# Patient Record
Sex: Male | Born: 1945 | Race: White | Hispanic: No | Marital: Married | Smoking: Never smoker
Health system: Southern US, Community
[De-identification: ages and names within clinical notes are randomized; demographics above are authoritative.]

## PROBLEM LIST (undated history)

## (undated) DIAGNOSIS — E119 Type 2 diabetes mellitus without complications: Secondary | ICD-10-CM

## (undated) DIAGNOSIS — I10 Essential (primary) hypertension: Secondary | ICD-10-CM

## (undated) DIAGNOSIS — I2510 Atherosclerotic heart disease of native coronary artery without angina pectoris: Secondary | ICD-10-CM

## (undated) DIAGNOSIS — C801 Malignant (primary) neoplasm, unspecified: Secondary | ICD-10-CM

## (undated) DIAGNOSIS — I509 Heart failure, unspecified: Secondary | ICD-10-CM

## (undated) HISTORY — PX: MOUTH SURGERY: SHX715

---

## 1999-11-05 ENCOUNTER — Encounter: Payer: Self-pay | Admitting: Otolaryngology

## 1999-11-12 ENCOUNTER — Inpatient Hospital Stay (HOSPITAL_COMMUNITY): Admission: RE | Admit: 1999-11-12 | Discharge: 1999-11-19 | Payer: Self-pay | Admitting: Otolaryngology

## 1999-11-12 ENCOUNTER — Encounter: Payer: Self-pay | Admitting: Otolaryngology

## 1999-11-12 ENCOUNTER — Encounter (INDEPENDENT_AMBULATORY_CARE_PROVIDER_SITE_OTHER): Payer: Self-pay | Admitting: Specialist

## 1999-11-14 ENCOUNTER — Encounter: Payer: Self-pay | Admitting: Otolaryngology

## 2000-11-02 ENCOUNTER — Encounter: Payer: Self-pay | Admitting: Family Medicine

## 2000-11-02 ENCOUNTER — Ambulatory Visit (HOSPITAL_COMMUNITY): Admission: RE | Admit: 2000-11-02 | Discharge: 2000-11-02 | Payer: Self-pay | Admitting: Family Medicine

## 2003-06-30 HISTORY — PX: COLON SURGERY: SHX602

## 2008-11-16 ENCOUNTER — Ambulatory Visit: Payer: Self-pay | Admitting: Family Medicine

## 2008-11-16 DIAGNOSIS — E785 Hyperlipidemia, unspecified: Secondary | ICD-10-CM

## 2008-11-16 DIAGNOSIS — Z8679 Personal history of other diseases of the circulatory system: Secondary | ICD-10-CM

## 2008-11-16 DIAGNOSIS — E1149 Type 2 diabetes mellitus with other diabetic neurological complication: Secondary | ICD-10-CM

## 2008-11-16 DIAGNOSIS — G894 Chronic pain syndrome: Secondary | ICD-10-CM | POA: Insufficient documentation

## 2008-11-16 DIAGNOSIS — Z87898 Personal history of other specified conditions: Secondary | ICD-10-CM

## 2008-11-16 DIAGNOSIS — I509 Heart failure, unspecified: Secondary | ICD-10-CM | POA: Insufficient documentation

## 2008-11-16 DIAGNOSIS — I10 Essential (primary) hypertension: Secondary | ICD-10-CM | POA: Insufficient documentation

## 2008-11-16 DIAGNOSIS — C189 Malignant neoplasm of colon, unspecified: Secondary | ICD-10-CM | POA: Insufficient documentation

## 2008-11-16 DIAGNOSIS — E119 Type 2 diabetes mellitus without complications: Secondary | ICD-10-CM

## 2008-11-16 DIAGNOSIS — C029 Malignant neoplasm of tongue, unspecified: Secondary | ICD-10-CM | POA: Insufficient documentation

## 2008-11-16 LAB — CONVERTED CEMR LAB
Glucose, Bld: 181 mg/dL
INR: 1.9

## 2008-11-19 ENCOUNTER — Telehealth (INDEPENDENT_AMBULATORY_CARE_PROVIDER_SITE_OTHER): Payer: Self-pay | Admitting: *Deleted

## 2010-07-29 ENCOUNTER — Inpatient Hospital Stay (HOSPITAL_COMMUNITY)
Admission: EM | Admit: 2010-07-29 | Discharge: 2010-08-01 | DRG: 603 | Disposition: A | Discharge status: Home Health | Payer: Medicare Other | Attending: Internal Medicine | Admitting: Internal Medicine

## 2010-07-29 DIAGNOSIS — I4891 Unspecified atrial fibrillation: Secondary | ICD-10-CM | POA: Diagnosis present

## 2010-07-29 DIAGNOSIS — L0231 Cutaneous abscess of buttock: Principal | ICD-10-CM | POA: Diagnosis present

## 2010-07-29 DIAGNOSIS — E871 Hypo-osmolality and hyponatremia: Secondary | ICD-10-CM | POA: Diagnosis present

## 2010-07-29 DIAGNOSIS — E785 Hyperlipidemia, unspecified: Secondary | ICD-10-CM | POA: Diagnosis present

## 2010-07-29 DIAGNOSIS — Z85038 Personal history of other malignant neoplasm of large intestine: Secondary | ICD-10-CM

## 2010-07-29 DIAGNOSIS — L89309 Pressure ulcer of unspecified buttock, unspecified stage: Secondary | ICD-10-CM | POA: Diagnosis present

## 2010-07-29 DIAGNOSIS — E1149 Type 2 diabetes mellitus with other diabetic neurological complication: Secondary | ICD-10-CM | POA: Diagnosis present

## 2010-07-29 DIAGNOSIS — E1142 Type 2 diabetes mellitus with diabetic polyneuropathy: Secondary | ICD-10-CM | POA: Diagnosis present

## 2010-07-29 DIAGNOSIS — I10 Essential (primary) hypertension: Secondary | ICD-10-CM | POA: Diagnosis present

## 2010-07-29 DIAGNOSIS — I5022 Chronic systolic (congestive) heart failure: Secondary | ICD-10-CM | POA: Diagnosis present

## 2010-07-29 DIAGNOSIS — I509 Heart failure, unspecified: Secondary | ICD-10-CM | POA: Diagnosis present

## 2010-07-29 DIAGNOSIS — L8990 Pressure ulcer of unspecified site, unspecified stage: Secondary | ICD-10-CM | POA: Diagnosis present

## 2010-07-29 DIAGNOSIS — Z7901 Long term (current) use of anticoagulants: Secondary | ICD-10-CM

## 2010-07-29 LAB — DIFFERENTIAL
Basophils Absolute: 0 10*3/uL (ref 0.0–0.1)
Basophils Relative: 0 % (ref 0–1)
Eosinophils Absolute: 0.2 10*3/uL (ref 0.0–0.7)
Eosinophils Relative: 3 % (ref 0–5)
Lymphocytes Relative: 12 % (ref 12–46)
Lymphs Abs: 1 10*3/uL (ref 0.7–4.0)
Monocytes Absolute: 0.9 10*3/uL (ref 0.1–1.0)
Monocytes Relative: 12 % (ref 3–12)
Neutro Abs: 5.7 10*3/uL (ref 1.7–7.7)
Neutrophils Relative %: 73 % (ref 43–77)

## 2010-07-29 LAB — BASIC METABOLIC PANEL
BUN: 5 mg/dL — ABNORMAL LOW (ref 6–23)
CO2: 28 mEq/L (ref 19–32)
Calcium: 8.9 mg/dL (ref 8.4–10.5)
Chloride: 85 mEq/L — ABNORMAL LOW (ref 96–112)
Creatinine, Ser: 0.8 mg/dL (ref 0.4–1.5)
GFR calc Af Amer: >60 mL/min (ref >60)
GFR calc non Af Amer: >60 mL/min (ref >60)
Glucose, Bld: 265 mg/dL — ABNORMAL HIGH (ref 70–99)
Potassium: 3.7 mEq/L (ref 3.5–5.1)
Sodium: 124 mEq/L — ABNORMAL LOW (ref 135–145)

## 2010-07-29 LAB — CBC
HCT: 37.6 % — ABNORMAL LOW (ref 39.0–52.0)
Hemoglobin: 13.6 g/dL (ref 13.0–17.0)
MCH: 33.4 pg (ref 26.0–34.0)
MCHC: 36.2 g/dL — ABNORMAL HIGH (ref 30.0–36.0)
MCV: 92.4 fL (ref 78.0–100.0)
Platelets: 217 10*3/uL (ref 150–400)
RBC: 4.07 MIL/uL — ABNORMAL LOW (ref 4.22–5.81)
RDW: 12.2 % (ref 11.5–15.5)
WBC: 7.8 10*3/uL (ref 4.0–10.5)

## 2010-07-29 LAB — PROTIME-INR: Prothrombin Time: 26.4 seconds — ABNORMAL HIGH (ref 11.6–15.2)

## 2010-07-29 NOTE — Letter (Signed)
Summary: New patient H and P  New patient H and P   Imported By: Donneta Romberg 11/19/2008 11:12:48  _____________________________________________________________________  External Attachment:    Type:   Image     Comment:   External Document

## 2010-07-30 LAB — CBC
MCH: 33.5 pg (ref 26.0–34.0)
MCV: 92.7 fL (ref 78.0–100.0)
Platelets: 204 10*3/uL (ref 150–400)
RDW: 12.3 % (ref 11.5–15.5)
WBC: 6.6 10*3/uL (ref 4.0–10.5)

## 2010-07-30 LAB — COMPREHENSIVE METABOLIC PANEL
ALT: 18 U/L (ref 0–53)
AST: 21 U/L (ref 0–37)
Alkaline Phosphatase: 45 U/L (ref 39–117)
CO2: 31 mEq/L (ref 19–32)
Calcium: 9 mg/dL (ref 8.4–10.5)
Chloride: 91 mEq/L — ABNORMAL LOW (ref 96–112)
GFR calc Af Amer: >60 mL/min (ref >60)
GFR calc non Af Amer: >60 mL/min (ref >60)
Potassium: 3.9 mEq/L (ref 3.5–5.1)
Sodium: 129 mEq/L — ABNORMAL LOW (ref 135–145)

## 2010-07-30 LAB — DIFFERENTIAL
Basophils Relative: 0 % (ref 0–1)
Eosinophils Absolute: 0.3 10*3/uL (ref 0.0–0.7)
Eosinophils Relative: 4 % (ref 0–5)
Lymphs Abs: 0.8 10*3/uL (ref 0.7–4.0)

## 2010-07-30 LAB — PROTIME-INR: INR: 2.89 — ABNORMAL HIGH (ref 0.00–1.49)

## 2010-07-30 LAB — GLUCOSE, CAPILLARY
Glucose-Capillary: 232 mg/dL — ABNORMAL HIGH (ref 70–99)
Glucose-Capillary: 285 mg/dL — ABNORMAL HIGH (ref 70–99)

## 2010-07-31 LAB — PROTIME-INR
INR: 3.13 — ABNORMAL HIGH (ref 0.00–1.49)
Prothrombin Time: 32.2 seconds — ABNORMAL HIGH (ref 11.6–15.2)

## 2010-07-31 LAB — BASIC METABOLIC PANEL
GFR calc non Af Amer: >60 mL/min (ref >60)
Potassium: 3.9 mEq/L (ref 3.5–5.1)
Sodium: 130 mEq/L — ABNORMAL LOW (ref 135–145)

## 2010-07-31 LAB — GLUCOSE, CAPILLARY
Glucose-Capillary: 274 mg/dL — ABNORMAL HIGH (ref 70–99)
Glucose-Capillary: 268 mg/dL — ABNORMAL HIGH (ref 70–99)
Glucose-Capillary: 274 mg/dL — ABNORMAL HIGH (ref 70–99)

## 2010-08-01 LAB — WOUND CULTURE

## 2010-08-01 LAB — PROTIME-INR: INR: 2.72 — ABNORMAL HIGH (ref 0.00–1.49)

## 2010-08-03 NOTE — Discharge Summary (Signed)
Lawrence Mcknight, Lawrence Mcknight                 ACCOUNT NO.:  [id number]  MEDICAL RECORD NO.:  [id number]           PATIENT TYPE:  I  LOCATION:  A318                          FACILITY:  [facility]  PHYSICIAN:  Marinda Elk, M.D.DATE OF BIRTH:  1946/03/18  DATE OF ADMISSION:  07/29/2010 DATE OF DISCHARGE:  02/03/2012LH                              DISCHARGE SUMMARY   PRIMARY CARE DOCTOR:  Dr. Augustina Mood Ward at [place], [place].  DISCHARGE DIAGNOSES: 1. Bed sore ulcer with cellulitis. 2. Diabetes type 2. 3. Chronic systolic heart failure. 4. Atrial fibrillation, currently on Coumadin. 5. Hyponatremia with an admission sodium of 124, on the day of     discharge 130.  DISCHARGE MEDICATIONS: 1. Doxycycline 100 mg p.o. b.i.d. for 14 days. 2. __________  mg daily. 3. Vitamin B12 three times daily. 4. Digoxin 0.125 mg 3 times daily. 5. Fenofibrate 145 mg one tablet daily. 6. Gabapentin 100 mg b.i.d. 7. Glipizide 10 mg daily. 8. Vicodin 7.5 mg/500 mg one tablet b.i.d. 9. Hydroxyzine 25 mg 2 tablets q.4 h. 10.Hydroxyzine pamoate 25 mg daily at bedtime. 11.Lasix 40 mg daily. 12.Metformin 1000 mg b.i.d. 13.Metoprolol succinate 50 mg daily. 14.NPH 20 units in the morning, 10 units in the evening subcutaneous     daily. 15.Ranitidine 150 mg 4 times a day. 16.Simvastatin 40 mg at bedtime. 17.Warfarin 2.5 mg daily.  IMAGING:  CT angiogram of the abdomen and pelvis showed a small supraumbilical hernia containing fat, right inguinal hernia containing fat, and anastomosis of the rectosigmoid compatible with prior tumor resection, minimal rectal wall thickening, maybe related to prior surgery, no small bowel obstruction.  There is skin thickening and subcutaneous infiltration at medial aspect of superior left buttock adjacent to crease, question cellulitis, full growing inflammatory process without underlying abscess collection.  We will follow up CT recommended in 4 months.  BRIEF ADMITTING HISTORY AND  PHYSICAL:  Please relate to H and P from July 29, 2010, for further details by Dr. Pleas Koch.  BRIEF HOSPITAL COURSE: 1. Cellulitis with pressure ulcer.  He was started on vancomycin.  He     did not spike any fever during his hospital stay.  His white count     remains stable, but the redness continued to decrease with     vancomycin.  He was changed to doxy IV, which he tolerated well.     He developed no adverse effect and his redness continued to     decrease.  We consulted Wound Care, they have recommended he also     follow up with his PCP in 2 weeks. 2. Diabetes type 2, no changes were made. 3. Chronic diastolic heart failure.  He is on dig where he says his     ACE was increased, and he is continued on beta-blockers, no further     changes were made. 4. Atrial fibrillation, currently rate controlled and INR therapeutic. 5. Hyponatremia, this improved with IV fluids, it is probably     secondary to dehydration.  Vitals on the day of discharge, temperature 93, pulse 73, respirations 19, and blood pressure 133/80.  He  was sattting 95% on room air.  Labs on the day of discharge showed his INR 2.7.  Wound cultures grew moderate staph, sensitive to the oxy.  DISPOSITION:  The patient will follow up with his primary care doctor in 1-2 weeks.  He will see where his blood pressure is doing.  Home health will also follow up with him.  They have explained and I have explained to him that he needs to be moving around and it is a probably a pressure ulcer that started all this.  Even since he has developed his peripheral neuropathy from his diabetes which he is on gabapentin for, he said it bothers him somewhat, so he will follow up with his primary care doctor.     Marinda Elk, M.D.     AF/MEDQ  D:  08/01/2010  T:  08/02/2010  Job:  [id number]  cc:   Augustina Mood Ward, Dr.  Electronically Signed by Lambert Keto M.D. on 08/03/2010 12:05:25 PM

## 2010-08-04 NOTE — H&P (Signed)
NAMEHODGE, STACHNIK NO.:  [id number]  MEDICAL RECORD NO.:  [id number]          PATIENT TYPE:  EMS  LOCATION:  ED                            FACILITY:  [facility]  PHYSICIAN:  Pleas Koch, MD        DATE OF BIRTH:  [birth year]  DATE OF ADMISSION:  07/29/2010 DATE OF DISCHARGE:  LH                             HISTORY & PHYSICAL   CURRENT FACILITY:  [facility].  PRIMARY CARE PHYSICIAN:  Ronn Melena, MD at [place], [place].  ONCOLOGIST:  Tyrone Apple. Lattie Corns, MD  CHIEF COMPLAINT: 1. Bedsore with cellulitic changes in a diabetic. 2. CHF, well compensated on Coumadin. 3. Hyponatremia, which is chronic.  HISTORY OF PRESENT ILLNESS:  The patient is a 65 year old male with known CHF; history of colon cancer resected in 2005; cancer of the tongue, status post treatment with Dr. Pollyann Kennedy, ENT.  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  The patient went to [facility] and it was noted and was seen by Dr. Wende Crease and was referred over for open ulcers with surrounding erythema tenderness in duration.  The patient states that he has had no fever, has no vomiting, thing has no nausea, he has never had an ulcer before.  The patient is nonmotile at baseline because of severe neuropathy and gets around in wheelchair.  The patient has been this way for the past 2-3 years.  The patient does state that when he lays too flat, he does get a little bit short of breath and this makes him uncomfortable.  He does have swelling of his lower extremities at baseline.  PAST MEDICAL HISTORY:  Significant for having colon cancer as dictated above and having chemotherapy subsequent to that.  He also has CHF, atrial fibrillation, hypertension, hyperlipidemia.  PAST  SURGICAL HISTORY:  As noted above.  The patient is not allergic to anything.  SOCIAL HISTORY:  The patient grew tobacco as a younger man and was in the [employer] in [place].  He used to smoke two packs per day for long time and then quit in 2001 subsequent to his diagnosis of cancer.  The patient also used to drink pretty heavily.  CURRENT MEDICATIONS:  Have been reconciled as; 1. Cyanocobalamin 1000 mg one tablet daily. 2. Digoxin 0.125 mg three tablets a day and this will be verified with     pharmacy. 3. Lasix 40 mg once a day. 4. Gabapentin 100 mg one to three times a day. 5. Gemfibrozil 600 one to two times a day. 6. Glipizide 10 mg one to two times a day. 7. Hydrocodone 7.5 as needed. 8. Insulin NPH 20/10 nightly. 9. Lisinopril mg once a day. 10.Metformin one to two times a day. 11.Metoprolol 100 mg half tablet per day, I think this is the XL     formation. 12.Simvastatin 40 mg at bedtime. 13.Warfarin specialized dosing.  REVIEW OF SYSTEMS:  As  above.  PHYSICAL EXAMINATION:  GENERAL:  The patient is an alert Caucasian male. No significant distress.  The patient has what appeared to be cataractous changes.  He also has swellings on the outer canthus of the eye. NECK:  Supple and soft.  I do not appreciate any JVD.  He is edentulous. He has bolus nose, which looks like rhinophyma.  I do not appreciate any bruits in the neck. CARDIOVASCULAR:  He has a soft murmur in the left and right upper sternal border.  No radiation.  I do not appreciate any rubs or gallops. ABDOMEN:  Soft, nontender with postsurgical changes. SKIN:  The patient has a large confluent area on the left buttocks, which has significant erythema and also has some element of a white denudation on the top of that.  Area is about 7-10 cm in size and it is irregular and has significant surrounding erythema. CENTRAL NERVOUS SYSTEM:  Cranial nerve II through XII grossly intact. He has very poor sensation and  has sensation only the L1-2.  Anteriorly, he has no sensation below knee.  The patient wears gloves at baseline because he has significant skin sensitivity as well on his hands.  Workup done in ED is as follows:  The patient had a BMET showing sodium of 124, potassium 3.7, chloride of 85, CO2 28, glucose 265, BUN of 5, creatinine 0.8, calcium 8.9.  Hemoglobin 13.6, hematocrit 37.6, WBCs 7.8, platelet count 217.  INR of 2.41.  He had a CT of the abdomen and pelvis which showed skin thickening of subcutaneous infiltration of medial aspect of superior left buttock adjacent to question cellulitis over the major process without underlying abscess collection small, supraumbilical right inguinal hernia containing with bladder wall thickening, nonspecific minimal rectal wall thickening question related to prior radiation, single borderline left internal inguinal lymph nodes.  In light of history of colon cancer, they recommend followup CT in 4 months to recess borderline large left internal iliac node to exclude metastatic disease, disease recurrence.  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 1. Cellulitis.  We will continue treatment as per ED physician with     clindamycin 600 mg q.6 hourly for 2 days and likely we will     transition to oral antibiotics.  We will get PT/OT to assess and     get a doughnut shape for the patient to sit on.  When seated, we     will get him out of bed with chair.  He has had a wound culture     done and we will follow this up.  I would hold on blood cultures  at     present. 2. Diabetes mellitus.  We will get an A1c to determine what is blood     control is as his glucose in the ED was 265.  He will continue on     his Lantus and we will give him sensitive coverage insulin for now.  We will hold his metformin and his glipizide at the present time. 1. For his anemia, we will continue cyanocobalamin. 2. For his heart failure, we will continue digoxin, but we will need     to reconcile the medications.  I will get digoxin level.  He will     continue his Lasix for now and his lisinopril and metoprolol at     above doses. 3. For his hyperlipidemia, we will continue Zocor. 4. History of colon cancer.  The patient may need further imaging and     this can be done as an outpatient at the [facility] when he follows     up with them.  Being on Coumadin, this may likely be because he has     an ejection fraction below 30%, or this may be because of his     atrial fibrillation.  We will get pharmacy to dose this.  The patient is a full code and will be admitted to [facility].                                           ______________________________ Pleas Koch, MD     JS/MEDQ  D:  07/29/2010  T:  07/29/2010  Job:  [id number]  cc:   Jeanice Lim, [place]  Electronically Signed by Pleas Koch MD on 08/04/2010 11:42:41 AM

## 2010-09-12 ENCOUNTER — Emergency Department (HOSPITAL_COMMUNITY): Payer: Medicare Other

## 2010-09-12 ENCOUNTER — Emergency Department (HOSPITAL_COMMUNITY)
Admission: EM | Admit: 2010-09-12 | Discharge: 2010-09-13 | Disposition: A | Discharge status: Home / Self Care | Payer: Medicare Other | Attending: Emergency Medicine | Admitting: Emergency Medicine

## 2010-09-12 DIAGNOSIS — Z79899 Other long term (current) drug therapy: Secondary | ICD-10-CM | POA: Insufficient documentation

## 2010-09-12 DIAGNOSIS — E119 Type 2 diabetes mellitus without complications: Secondary | ICD-10-CM | POA: Insufficient documentation

## 2010-09-12 DIAGNOSIS — I4891 Unspecified atrial fibrillation: Secondary | ICD-10-CM | POA: Insufficient documentation

## 2010-09-12 DIAGNOSIS — I509 Heart failure, unspecified: Secondary | ICD-10-CM | POA: Insufficient documentation

## 2010-09-12 DIAGNOSIS — R55 Syncope and collapse: Secondary | ICD-10-CM | POA: Insufficient documentation

## 2010-09-12 LAB — COMPREHENSIVE METABOLIC PANEL
ALT: 16 U/L (ref 0–53)
AST: 19 U/L (ref 0–37)
Albumin: 3.6 g/dL (ref 3.5–5.2)
Alkaline Phosphatase: 37 U/L — ABNORMAL LOW (ref 39–117)
Calcium: 9.2 mg/dL (ref 8.4–10.5)
GFR calc Af Amer: >60 mL/min (ref >60)
Potassium: 4.3 mEq/L (ref 3.5–5.1)
Sodium: 124 mEq/L — ABNORMAL LOW (ref 135–145)
Total Protein: 6.6 g/dL (ref 6.0–8.3)

## 2010-09-12 LAB — DIFFERENTIAL
Basophils Absolute: 0.1 10*3/uL (ref 0.0–0.1)
Basophils Relative: 1 % (ref 0–1)
Eosinophils Relative: 18 % — ABNORMAL HIGH (ref 0–5)
Lymphocytes Relative: 10 % — ABNORMAL LOW (ref 12–46)
Monocytes Absolute: 1.1 10*3/uL — ABNORMAL HIGH (ref 0.1–1.0)
Neutro Abs: 6.4 10*3/uL (ref 1.7–7.7)

## 2010-09-12 LAB — URINALYSIS, ROUTINE W REFLEX MICROSCOPIC
Bilirubin Urine: NEGATIVE
Glucose, UA: 100 mg/dL — AB
Hgb urine dipstick: NEGATIVE
Ketones, ur: NEGATIVE mg/dL
pH: 5.5 (ref 5.0–8.0)

## 2010-09-12 LAB — CBC
Hemoglobin: 14.4 g/dL (ref 13.0–17.0)
MCHC: 35 g/dL (ref 30.0–36.0)
RDW: 13 % (ref 11.5–15.5)
WBC: 10.5 10*3/uL (ref 4.0–10.5)

## 2010-11-14 NOTE — Consult Note (Signed)
[facility]  Patient:    [name], Lawrence Mcknight                          MRN: [id number] Proc. Date: 11/15/99 Adm. Date:  [id number] Attending:  Serena Colonel H CC:         Miguel Aschoff, M.D.                          Consultation Report  INDICATIONS:  Atrial flutter with rapid ventricular response.  CONCLUSIONS: 1. Atrial flutter with 2:1 AV conduction and ventricular response of 155 beats    per minute.    a. Resistant to IV Cardizem.    b. Hemodynamically stable with systolic pressure greater than or equal to       120. 2. Squamous cell carcinoma of the tongue.    a. Status post hemiglossectomy and neck dissection on Nov 12, 1999. 3. History of tobacco abuse. 4. No history of heart disease or lung disease.  RECOMMENDATIONS: 1. IV Cardizem and bolus IV beta blocker therapy for rate control. 2. IV Corvert after ventricular rate is controlled. 3. If IV Corvert consider IV amiodarone.  BRIEF HISTORY:  The patient is 65 years of age.  He is recently status post hemiglossectomy for tongue cancer.  This afternoon in MICU he developed atrial flutter with rapid ventricular response but hemodynamic stability.  A total of 30 mg of IV Cardizem was given and an IV infusion of Cardizem at 12 mg per hour was started.  This did not slow the ventricular response.  IV Adenocard 6 mg gave transient high grade AV block revealing atrial flutter waves.  He is hemodynamically stable and having no cardiopulmonary symptoms.  He has no history of supraventricular tachycardia.  PHYSICAL EXAMINATION:  VITAL SIGNS:  The patients blood pressure is 120/80, heart rate is 150, monitor reveals atrial flutter with 2:1 conduction and a ventricular rate of 155.  CHEST:  Chest is clear.  CARDIAC:  Reveals no murmurs, rubs, or clicks.  EXTREMITIES:  Reveal no edema.  EKG on Nov 05, 1999, reveals incomplete right bundle branch block with QT of 404 milliseconds. DD:   11/15/99 TD:  11/15/99 Job: [id number]

## 2010-11-14 NOTE — Discharge Summary (Signed)
[place]  Patient:    Lawrence Mcknight, Lawrence Mcknight                          MRN: [id number] Adm. Date:  [id number] Disc. Date: [id number] Attending:  Serena Colonel H CC:         Dr. Sherrin Daisy, [place]                           Discharge Summary  ADMISSION DIAGNOSIS:  Squamous cell carcinoma floor of mouth.  DISCHARGE DIAGNOSES: 1. Squamous cell carcinoma floor of mouth. 2. Status post atrial flutter and possible hypertension.  PROCEDURES:  Bilateral neck dissection, floor of mouth resection, laryngoscopy, and esophagoscopy with biopsy.  CONSULTATIONS: 1. Dr. Anastasio Auerbach from hospitalist service. 2. Cardiology.  COMPLICATIONS:  Postoperative development of atrial flutter and hypertension.  HISTORY OF PRESENT ILLNESS:  [age]-year-old gentleman admitted for surgical treatment of a floor of mouth carcinoma.  HOSPITAL COURSE:  He tolerated the above-mentioned operative procedures very well and was transferred to the intensive care unit postoperatively.  He spent about three or four days in the intensive care unit, mainly because of the cardiac complications.  He was followed by the hospitalist service and the cardiology service, and medically treated for the atrial flutter.  At the time of discharge, he was in normal sinus rhythm for about three days, with stable vital signs.  He was having some hypertension that was treated with Cardizem, but his blood pressure had normalized on the day of discharge, and plan was made for him to follow up with his primary care physician.  As far as the surgery was concerned, he tolerated that very well and had his drains removed on postoperative day #4, had his feeding tube removed on postoperative day #6, and was on a soft diet at the time of discharge.  The pathologic findings revealed clear margins and no pathologic lymph nodes in the neck dissection specimens.  He was discharged to home in satisfactory condition.   Instructed to stay on a soft diet.  Use Lortab elixir 3 or 4 teaspoons every four to six hours for pain, and to follow up with his primary care physician about his cardiac problems and his hypertension.  He is to follow up with me in two to three weeks.  His primary care physician is Dr. Regino Schultze in [place]. DD:  11/19/99 TD:  11/22/99 Job: [id number]

## 2010-11-14 NOTE — Op Note (Signed)
[place]  Patient:    Lawrence Mcknight, Lawrence Mcknight                          MRN: [id number] Proc. Date: 11/12/99 Adm. Date:  [id number] Attending:  Susy Frizzle CC:         Kirk Ruths, M.D., [place], [place]                           Operative Report  PREOPERATIVE DIAGNOSIS:  T2, N0 squamous cell carcinoma, left floor of mouth.  POSTOPERATIVE DIAGNOSIS:  T2, N0 squamous cell carcinoma, left floor of mouth.  PROCEDURE: 1. Direct laryngoscopy. 2. Esophagoscopy with biopsy. 3. Floor of mouth/tongue resection with primary closure. 4. Bilateral modified neck dissection.  SURGEON:  Jefry H. Pollyann Kennedy, M.D.  ASSISTANT:  Margit Banda. Jearld Fenton, M.D.  ANESTHESIA:  General endotracheal anesthesia was used.  COMPLICATIONS:  None.  FINDINGS: 1. Irregular nodular-type mass involving the posterior wall of the esophagus,    approximately 5 cm below the cricopharyngeus. 2. A 4.0 x 2.5-cm ulcerative mass involving the left floor of mouth,    approximately 1 cm off the midline anteriorly. 3. Bilateral multiple firm lymph nodes from 1 to 2 cm in diameter.  ESTIMATED BLOOD LOSS:  100 cc.  SPECIMENS TO PATHOLOGY: 1. Esophageal biopsy. 2. Left lingual nerve posterior margin resection; frozen section negative for    tumor. 3. Floor of mouth/tongue resection. 4. Deep margin floor of mouth; frozen section negative for tumor. 5. Bilateral modified neck dissection.  REFERRING PHYSICIAN:  Kirk Ruths, M.D.  HISTORY:  This is a gentleman with a recent history of soreness under the tongue on the left side with biopsy-proven squamous cell carcinoma.  Risks, benefits, alternatives and complications of the procedure were explained to the patient and his wife, who seemed to understand and agreed to surgery.  DESCRIPTION OF PROCEDURE:  Patient was taken to the operating room and placed on the operating table in a supine position.  Following the induction of general  endotracheal anesthesia via the nasotracheal route, the table was turned 90 degrees and the patient was positioned for upper endoscopy.  #1 - Direct laryngoscopy:  An anterior commissure scope was used to view the laryngeal structures including the hypopharynx and the larynx.  There were no abnormalities identified.  Careful evaluation was accomplished including the piriform sinuses, the interarytenoid area, the post-glottic area as well as the vallecula.  #2 - Esophagoscopy:  A rigid esophagoscope was entered into the oral cavity, passed through the cricopharyngeus and passed as far down as the scope would reached.  It was then gently brought back, inspecting all four quadrants of the esophageal wall.  The above-mentioned finding was noted in the posterior wall and biopsy was taken of this area.  This was sent for pathologic evaluation.  The scope was then removed.  The table was turned to its normal position and the patient was prepped and draped in a standard fashion.  #3 - Floor of mouth/tongue resection:  A towel clip was used to retract the tip of the tongue outward and towards the right side and bilateral cheek retractors were used with visualization.  The ulcerative lesion was identified and was excised using electrocautery, initially to mark the mucosa, providing 1- to 2-cm grossly clear margins in all directions.  Careful dissection through the musculature layers of the tongue was accomplished.  The lingual  artery was identified and ligated with silk suture.  The lingual nerve was identified and was followed back to just behind the angle of the mandible and biopsy was taken.  The lesion was then removed in its entirety, making sure to keep a healthy cuff of tissue surrounding all dimensions.  An extra-deep margin was taken for frozen section and was clear.  The resection specimen was labeled appropriately and sent for pathologic evaluation.  #4 - Bilateral modified neck  dissection:  An apron incision was outlined from mastoid tip to mastoid tip, crossing over the midportion of the thyroid cartilage, and was incised with electrocautery.  Dissection continued through subcutaneous tissue and platysma and subplatysmal flaps were developed superiorly to the entire mandible and inferiorly to the clavicles.  Bilateral modified neck dissections were accomplished, encompassing levels through 1 through 3 on both sides.  The following structures were identified and preserved bilaterally:  The marginal mandibular branch of the facial nerve, the mylohyoid muscle, the anterior and posterior bellies of the digastric muscles, the hypoglossal nerves, the common carotid, external and internal carotid arteries, the internal jugular vein, the vagus nerve, the cutaneous branches of the cervical plexus, the spinal accessory nerve.  The limits of dissection were as follows:  Superiorly and posteriorly, the fascia overlying the splenius capitis and levator scapular muscles; posteriorly, the cutaneous branches of the cervical plexus; inferiorly the crossing point between the omohyoid muscle and the sternocleidomastoid muscle; anteriorly, the strap muscles; and superiorly, the mandible.  The facial vessels were ligated.  The fibrofatty tissue between the anterior bellies of the digastric muscle was carefully cleaned out as well.  During the lower dissection in level 3 on the left side, a very large-caliber lymphatic duct was identified and was clamped with a right-angle clamp.  There were multiple nodes seen throughout all levels on both sides.  The specimen was labeled appropriately and sent for pathologic evaluation.  The wound was irrigated with saline and with electrocautery and silk ties were used as needed for hemostasis.  Two 10-French round drains were left, one on each side of the neck in the wound, and exited through separate incisions, secured in place with silk suture.   The wound was closed in layers using 3-0 chromic on the platysmal layer and staples on the skin.  Patient was then awakened, extubated and transferred to  recovery in stable condition with a nasogastric feeding tube in the right side and a nasal trumpet on the left side. DD:  11/12/99 TD:  11/17/99 Job: [id number]

## 2010-11-14 NOTE — H&P (Signed)
[place]  Patient:    Lawrence Mcknight, Lawrence Mcknight                          MRN: [id number] Adm. Date:  11/12/99 Attending:  Jeannett Senior. Pollyann Kennedy, M.D. CC:         Dr. Sherrin Daisy, [place]                         History and Physical  ADMISSION DIAGNOSIS:  Floor of mouth carcinoma.  HISTORY:  This 65 year old with a 3-4 week history of a painful sore under his tongue on the left side with pain radiating towards the left ear.  It is worse when he swallows.  He denies any numbness or tingling of the mouth, lips, or tongue.  He underwent a biopsy of this lesion and it revealed a squamous cell carcinoma.  PAST MEDICAL HISTORY:  Negative.  MEDICATIONS:  None.  SOCIAL HISTORY:  He quit drinking 8 years ago.  Currently smokes one pack per day and drinks a large amount of coffee, approximately two pots each day.  REVIEW OF SYSTEMS:  Otherwise negative.  Negative for weight loss or any loss of appetite.  PHYSICAL EXAMINATION:  GENERAL:  Healthy-appearing gentleman in no distress.  HEENT:  There is no palpable adenopathy.  Oral cavity and pharynx:  There is a 3.5 x 1.5 cm ulceration involving the left lateral floor of mouth up into the anterior floor of mouth and undersurface of the tongue.  It is off of the alveolar ridge.  He is completely edentulous.  CHEST:  Clear to auscultation.  HEART:  Regular rate and rhythm.  ABDOMEN:  Soft.  EXTREMITIES:  Unremarkable.  IMPRESSION AND PLAN:  T2, N0, squamous cell carcinoma left floor of mouth and tongue.  RECOMMENDATIONS:  Recommend admission to the hospital at which time he will undergo resection of floor of mouth and partial glossectomy, bilateral modified neck dissection for staging purposes, laryngoscopy and esophagoscopy to search for a second primary, and possible tracheostomy. DD:  11/11/99 TD:  11/11/99 Job: [id number]

## 2012-04-12 IMAGING — CT CT PELVIS W/ CM
2 of 4 series · 15 of 46 positions shown, 17 images · IV contrast (omnipaque)
Comparison: None

CLINICAL DATA: Draining wound at but not, erythema and induration,
past history of carcinoma of the colon with surgery, chemotherapy
and radiation therapy, hypertension, diabetes

CT PELVIS WITH CONTRAST
TECHNIQUE: Multidetector CT imaging of the pelvis was performed
using the standard protocol following the bolus administration of
intravenous contrast.  Sagittal and coronal MPR images
reconstructed from axial data set.
Contrast:  100 ml Omnipaque 300 IV; No oral contrast administered.

[Series 4: pel_with 3.0 spo · coronal · 0.60mm/px · 3 of 105 slices shown]
[im 35/105  soft-tissue]
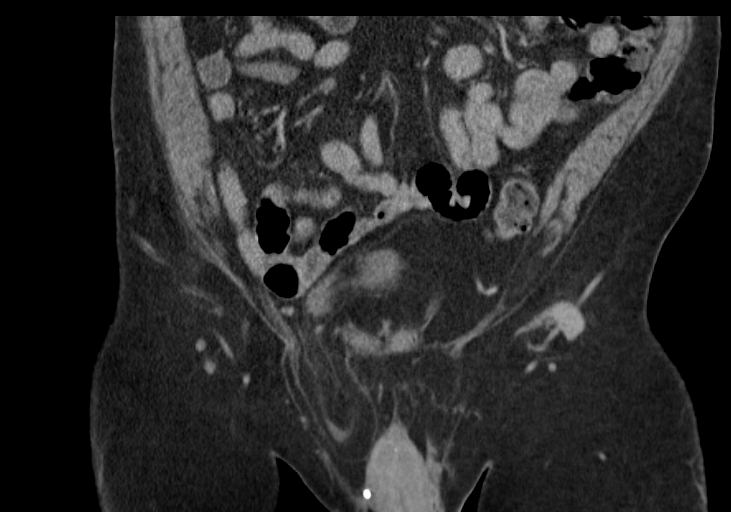
[im 47/105  soft-tissue]
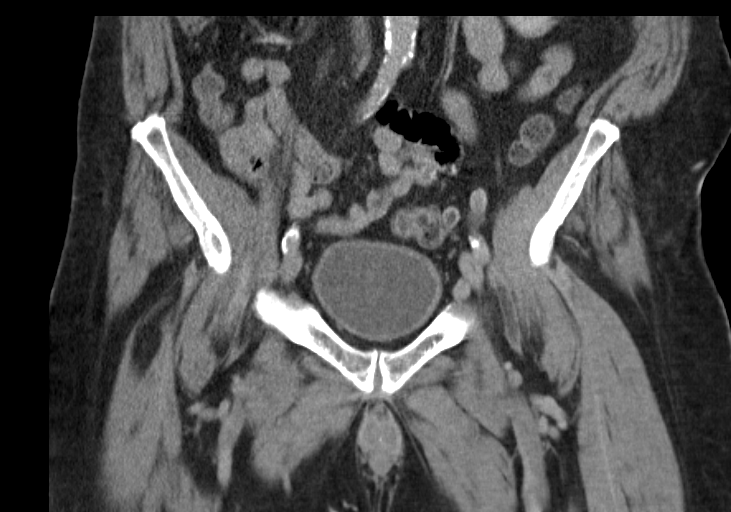
[im 58/105  soft-tissue]
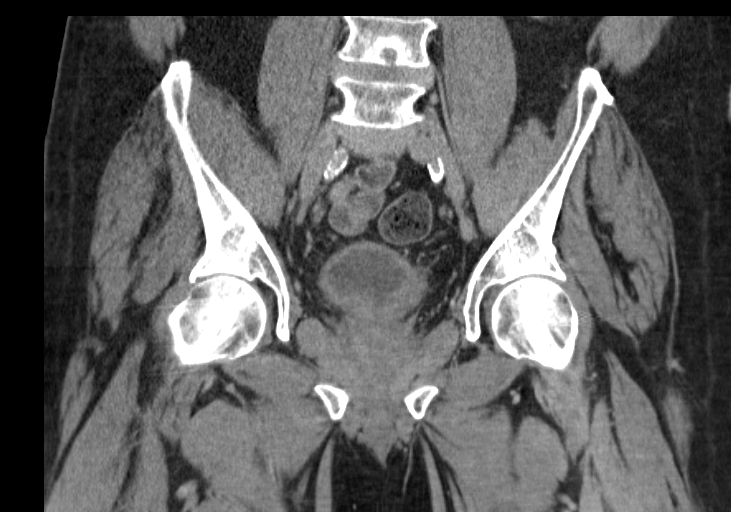

[Series 7: delay kidney · axial · delayed · 0.89mm/px · z∈[-584,-300]mm · 12 of 67 slices shown, 14 images]
[im 5/67  soft-tissue]
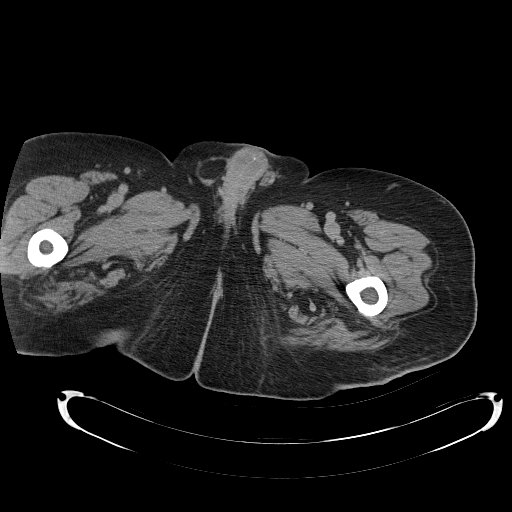
[im 5/67  bone]
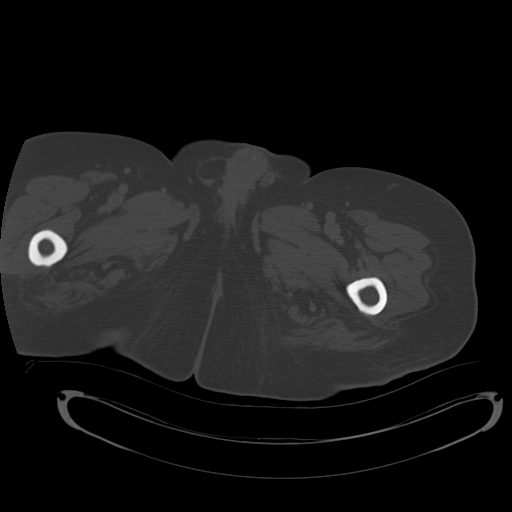
[im 9/67  soft-tissue]
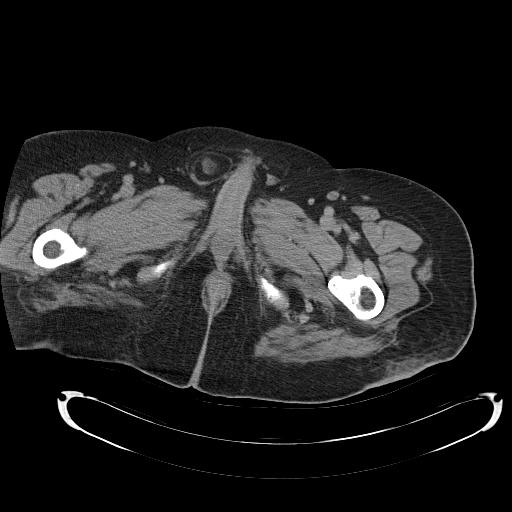
[im 17/67  soft-tissue]
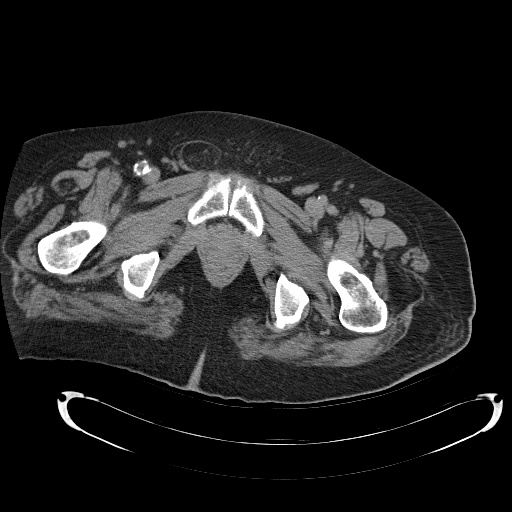
[im 21/67  soft-tissue]
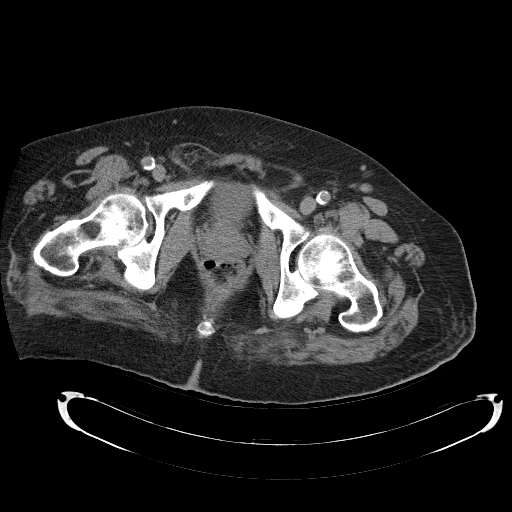
[im 25/67  soft-tissue]
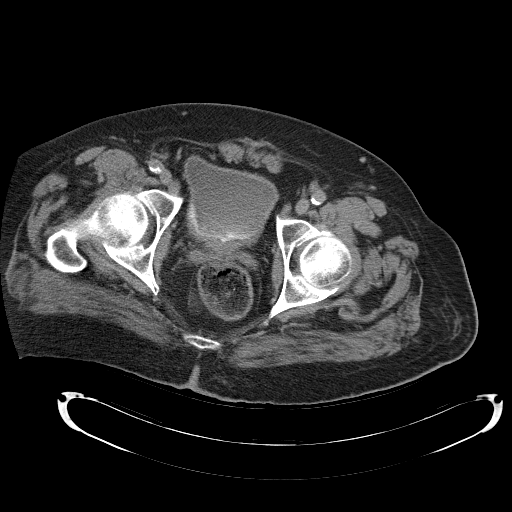
[im 29/67  soft-tissue]
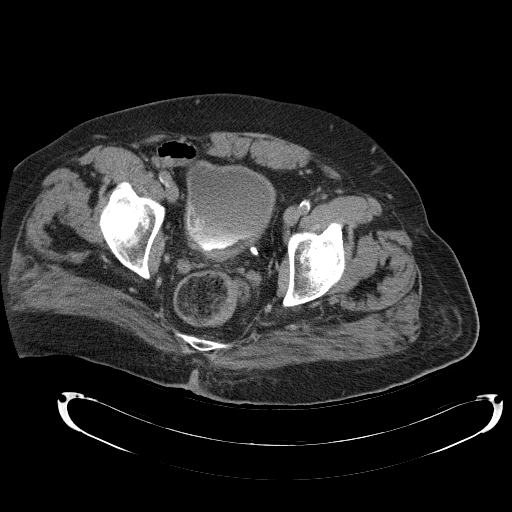
[im 38/67  soft-tissue]
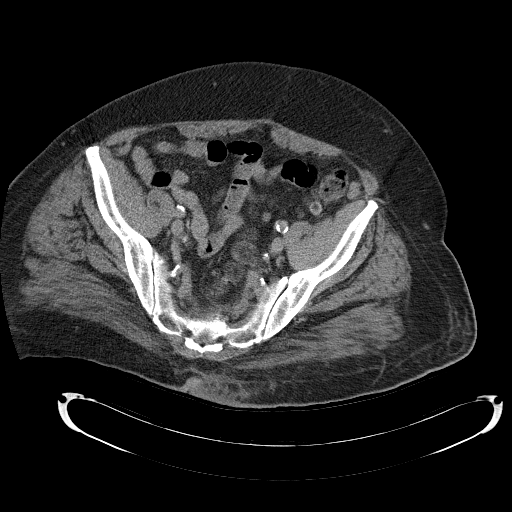
[im 42/67  soft-tissue]
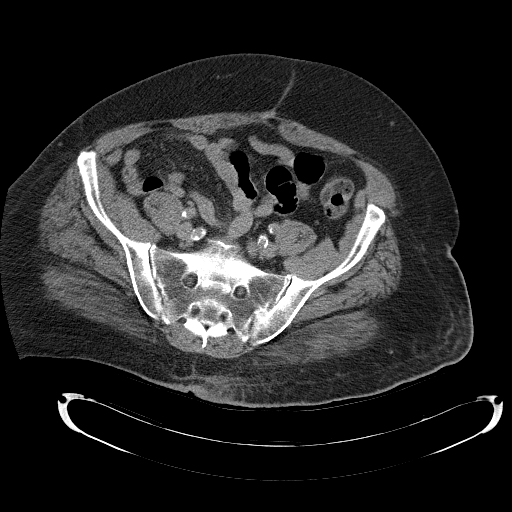
[im 46/67  soft-tissue]
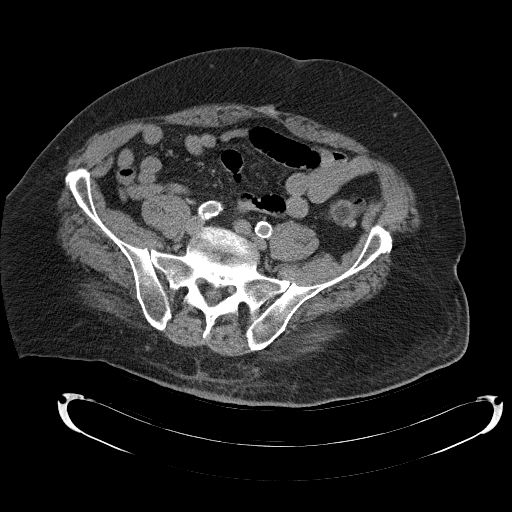
[im 46/67  bone]
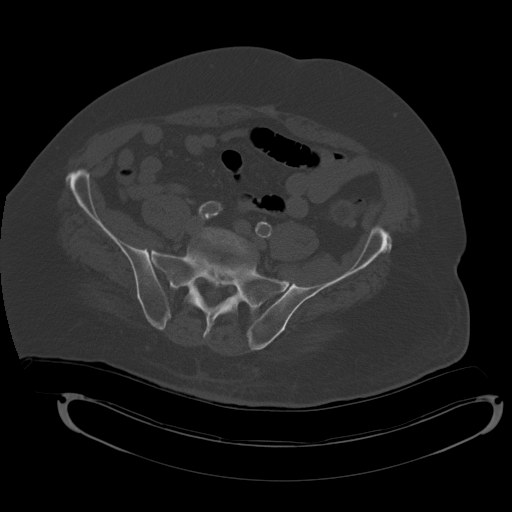
[im 50/67  soft-tissue]
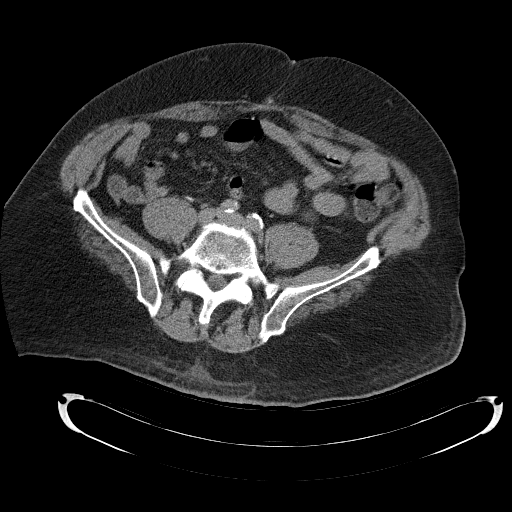
[im 58/67  soft-tissue]
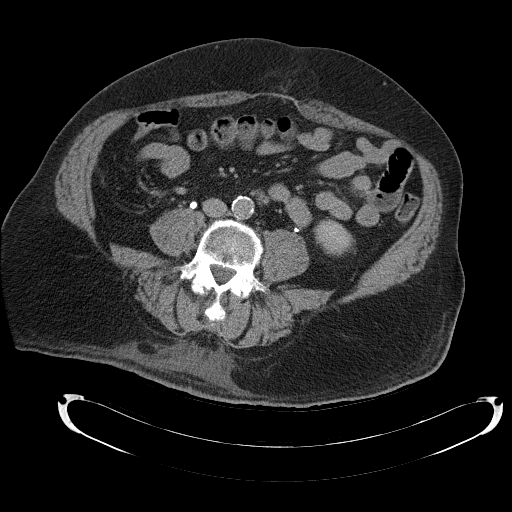
[im 62/67  soft-tissue]
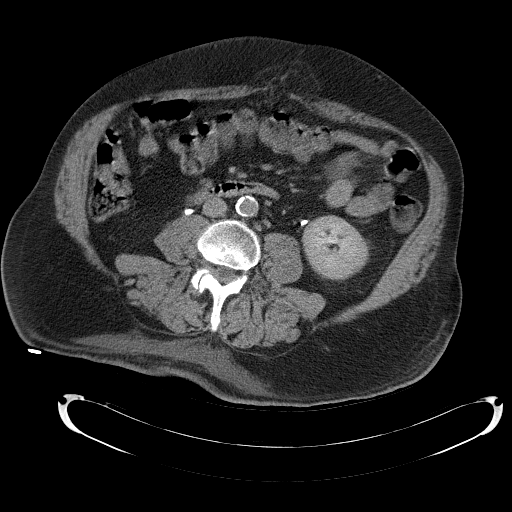

[15 of 46 positions shown; findings below may reference images not displayed]

FINDINGS: Small supraumbilical hernia containing fat.
Right inguinal hernia containing fat.
Anastomosis at rectosigmoid colon compatible with prior tumor
resection.
Minimal rectal wall thickening, which may be related to prior
radiation therapy.
Small bowel loops unremarkable.
No bowel wall thickening, mass, or ascites.
Single borderline enlarged left external iliac lymph node 16 x 10
mm image 34.
Normal-sized left external iliac node 13 x 8 mm image 27.
No definite additional adenopathy.

Mild wall thickening of the bladder, nonspecific.
This can be seen with chronic outlet obstruction,
infection/inflammation, and with prior radiation therapy.
Prostate gland does not appear significantly enlarged.
Scattered atherosclerotic calcifications.
No acute osseous findings.

Subcutaneous edema identified dorsal to lower lumbar spine.
Focal area of erythema and skin thickening is identified at the
medial aspect of the left buttock adjacent to the crease, without
discrete abscess collection or gas within the subcutaneous tissues.
No definite abnormalities of the right buttock are identified.
No other regional soft tissue abnormalities seen.
IMPRESSION: Skin thickening and subcutaneous infiltration at medial aspect of
superior left buttock adjacent to crease, question cellulitis/focal
inflammatory process, without underlying abscess collection or gas.
Small supraumbilical and right inguinal hernias containing fat.
Bladder wall thickening, nonspecific as above.
Minimal rectal wall thickening, question related to prior radiation
therapy.
Single borderline enlarged left internal iliac lymph node as above.
In light of history colon cancer, recommend follow-up CT imaging in
4 months to reassess borderline enlarged left internal iliac lymph
node, to exclude metastatic disease/tumor recurrence.

## 2012-05-27 IMAGING — CR DG CHEST 1V PORT
1 series · 1 of 1 positions shown · non-contrast
Comparison: None.

CLINICAL DATA: 64-year-old male with syncope.

PORTABLE CHEST - 1 VIEW

[view not recorded]
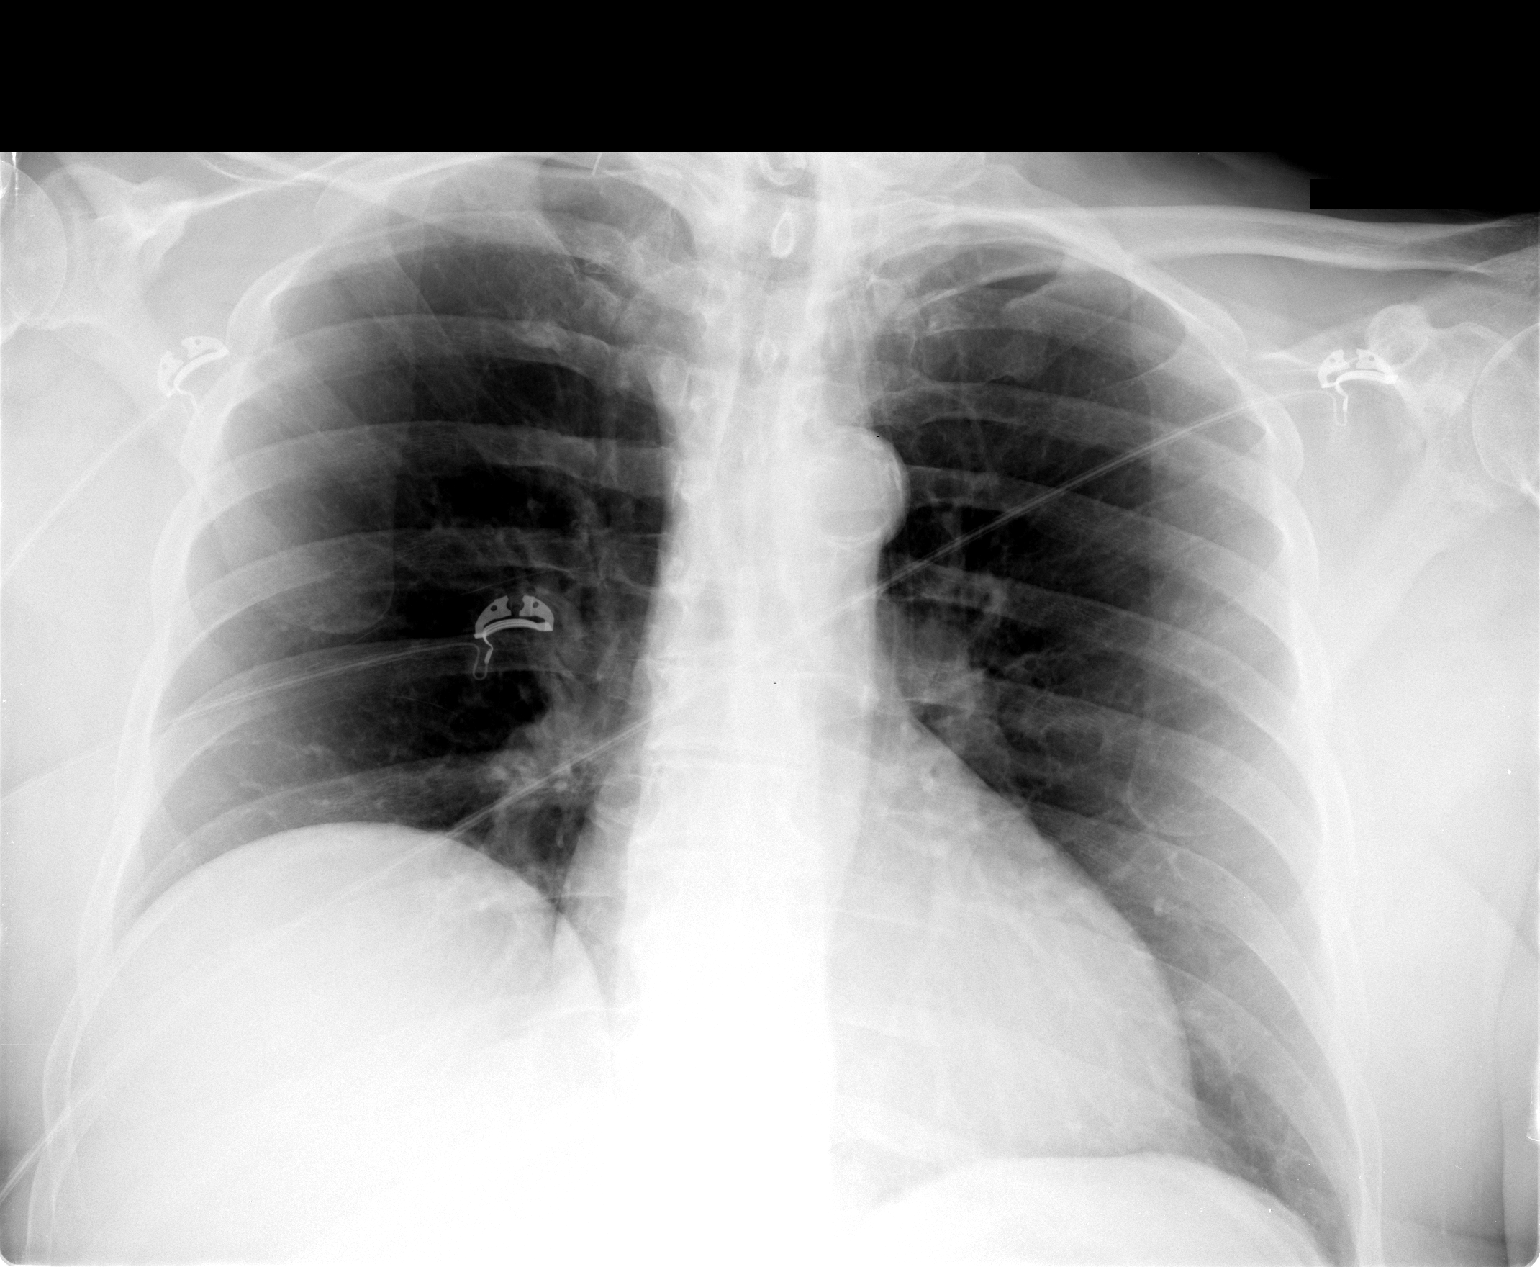

[1 of 1 positions shown; findings below may reference images not displayed]

FINDINGS: Portable AP upright view at 5956 hours.  Elevated right
hemidiaphragm.  The lungs are clear.  Cardiac size and mediastinal
contours are within normal limits.  A small surgical clips at the
right thoracic inlet.  No pneumothorax or effusion.
IMPRESSION: No acute cardiopulmonary abnormality.

## 2013-04-07 ENCOUNTER — Encounter (HOSPITAL_BASED_OUTPATIENT_CLINIC_OR_DEPARTMENT_OTHER): Payer: Medicare Other | Attending: General Surgery

## 2013-04-07 DIAGNOSIS — R32 Unspecified urinary incontinence: Secondary | ICD-10-CM | POA: Insufficient documentation

## 2013-04-07 DIAGNOSIS — B356 Tinea cruris: Secondary | ICD-10-CM | POA: Insufficient documentation

## 2013-04-07 DIAGNOSIS — G5790 Unspecified mononeuropathy of unspecified lower limb: Secondary | ICD-10-CM | POA: Insufficient documentation

## 2013-04-07 DIAGNOSIS — I10 Essential (primary) hypertension: Secondary | ICD-10-CM | POA: Insufficient documentation

## 2013-04-07 DIAGNOSIS — Z7901 Long term (current) use of anticoagulants: Secondary | ICD-10-CM | POA: Insufficient documentation

## 2013-04-07 DIAGNOSIS — Z79899 Other long term (current) drug therapy: Secondary | ICD-10-CM | POA: Insufficient documentation

## 2013-04-07 DIAGNOSIS — B358 Other dermatophytoses: Secondary | ICD-10-CM | POA: Insufficient documentation

## 2013-04-07 DIAGNOSIS — Z794 Long term (current) use of insulin: Secondary | ICD-10-CM | POA: Insufficient documentation

## 2013-04-07 DIAGNOSIS — E119 Type 2 diabetes mellitus without complications: Secondary | ICD-10-CM | POA: Insufficient documentation

## 2013-04-07 DIAGNOSIS — Z85038 Personal history of other malignant neoplasm of large intestine: Secondary | ICD-10-CM | POA: Insufficient documentation

## 2013-04-07 DIAGNOSIS — L98499 Non-pressure chronic ulcer of skin of other sites with unspecified severity: Secondary | ICD-10-CM | POA: Insufficient documentation

## 2013-04-07 DIAGNOSIS — E669 Obesity, unspecified: Secondary | ICD-10-CM | POA: Insufficient documentation

## 2013-04-07 DIAGNOSIS — Z85810 Personal history of malignant neoplasm of tongue: Secondary | ICD-10-CM | POA: Insufficient documentation

## 2013-04-08 NOTE — Progress Notes (Signed)
[facility]  NAME:  Lawrence Mcknight, Lawrence Mcknight NO.:  [id number]  MEDICAL RECORD NO.:  [id number]      DATE OF BIRTH:  1945-11-13  PHYSICIAN:  Ardath Sax, M.D.           VISIT DATE:                                  OFFICE VISIT   This is a 67 year old obese male who comes to [place] because of an erythemic rash all over his buttocks and groin.  He is a type 2 diabetic.  He is morbidly obese.  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  Apparently for the last couple of years for some unknown reason, he has stayed in bed, he is incontinent, and he does not walk, he hardly gets out of bed.  Because of this, he has got this rash all over his buttocks and groin, it looks like a fungal dermatitis.  He has several superficial ulcerations secondary to the fungal infection.  He is on many drugs including warfarin, prednisone, metoprolol, lisinopril, NovoLog insulin, hydroxyzine, gabapentin, Lasix, digoxin, ranitidine, and calcium and vitamins.  When he came here, his blood pressure was 95/63, respirations 18, pulse 80, temperature 98, he weighs 260 pounds.  This gentleman is alert and can move his legs, but apparently when he tries to stand, he is unable to hold up his weight. I really think he have some physical therapy, but according to the wife this has been tried and it was unsuccessful.  We are going to treat this with nystatin powder and antifungal cream because I really think that this is all fungal infection from being on antibiotics in the past and being incontinent of urine and laying in a wet bed and it just has the look of fungal dermatitis.  He will come back here in a week.  DIAGNOSES: 1. 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 2. Obesity. 3. Type 2 diabetes. 4. Hypertension.     Ardath Sax, M.D.     PP/MEDQ  D:  04/07/2013  T:  04/08/2013  Job:  [id number]

## 2013-05-19 ENCOUNTER — Ambulatory Visit (HOSPITAL_BASED_OUTPATIENT_CLINIC_OR_DEPARTMENT_OTHER): Payer: Medicare Other

## 2013-05-19 ENCOUNTER — Encounter (HOSPITAL_BASED_OUTPATIENT_CLINIC_OR_DEPARTMENT_OTHER): Payer: Medicare Other | Attending: General Surgery

## 2013-05-19 ENCOUNTER — Encounter (HOSPITAL_BASED_OUTPATIENT_CLINIC_OR_DEPARTMENT_OTHER): Payer: Medicare Other

## 2013-05-19 DIAGNOSIS — B358 Other dermatophytoses: Secondary | ICD-10-CM | POA: Insufficient documentation

## 2013-05-19 DIAGNOSIS — L97109 Non-pressure chronic ulcer of unspecified thigh with unspecified severity: Secondary | ICD-10-CM | POA: Insufficient documentation

## 2013-05-19 DIAGNOSIS — L98499 Non-pressure chronic ulcer of skin of other sites with unspecified severity: Secondary | ICD-10-CM | POA: Insufficient documentation

## 2013-06-02 ENCOUNTER — Encounter (HOSPITAL_BASED_OUTPATIENT_CLINIC_OR_DEPARTMENT_OTHER): Payer: Medicare Other | Attending: General Surgery

## 2013-06-02 DIAGNOSIS — B356 Tinea cruris: Secondary | ICD-10-CM | POA: Insufficient documentation

## 2013-06-02 DIAGNOSIS — L258 Unspecified contact dermatitis due to other agents: Secondary | ICD-10-CM | POA: Insufficient documentation

## 2013-07-14 ENCOUNTER — Encounter (HOSPITAL_BASED_OUTPATIENT_CLINIC_OR_DEPARTMENT_OTHER): Payer: Medicare Other | Attending: General Surgery

## 2013-07-14 DIAGNOSIS — B368 Other specified superficial mycoses: Secondary | ICD-10-CM | POA: Insufficient documentation

## 2013-08-11 ENCOUNTER — Encounter (HOSPITAL_BASED_OUTPATIENT_CLINIC_OR_DEPARTMENT_OTHER): Payer: Medicare Other | Attending: General Surgery

## 2013-08-11 DIAGNOSIS — B358 Other dermatophytoses: Secondary | ICD-10-CM | POA: Insufficient documentation

## 2013-09-08 ENCOUNTER — Encounter (HOSPITAL_BASED_OUTPATIENT_CLINIC_OR_DEPARTMENT_OTHER): Payer: Medicare Other | Attending: General Surgery

## 2013-09-08 DIAGNOSIS — B358 Other dermatophytoses: Secondary | ICD-10-CM | POA: Insufficient documentation

## 2013-10-13 ENCOUNTER — Encounter (HOSPITAL_BASED_OUTPATIENT_CLINIC_OR_DEPARTMENT_OTHER): Payer: Medicare Other | Attending: General Surgery

## 2013-10-13 DIAGNOSIS — B358 Other dermatophytoses: Secondary | ICD-10-CM | POA: Insufficient documentation

## 2013-11-06 ENCOUNTER — Inpatient Hospital Stay (HOSPITAL_COMMUNITY)
Admission: EM | Admit: 2013-11-06 | Discharge: 2013-11-08 | DRG: 690 | Disposition: A | Discharge status: Home / Self Care | Payer: Medicare Other | Attending: Internal Medicine | Admitting: Internal Medicine

## 2013-11-06 ENCOUNTER — Encounter (HOSPITAL_COMMUNITY): Payer: Self-pay | Admitting: Emergency Medicine

## 2013-11-06 DIAGNOSIS — Z7401 Bed confinement status: Secondary | ICD-10-CM

## 2013-11-06 DIAGNOSIS — K219 Gastro-esophageal reflux disease without esophagitis: Secondary | ICD-10-CM | POA: Diagnosis present

## 2013-11-06 DIAGNOSIS — I5032 Chronic diastolic (congestive) heart failure: Secondary | ICD-10-CM | POA: Diagnosis present

## 2013-11-06 DIAGNOSIS — Z9221 Personal history of antineoplastic chemotherapy: Secondary | ICD-10-CM

## 2013-11-06 DIAGNOSIS — Z79899 Other long term (current) drug therapy: Secondary | ICD-10-CM

## 2013-11-06 DIAGNOSIS — C029 Malignant neoplasm of tongue, unspecified: Secondary | ICD-10-CM

## 2013-11-06 DIAGNOSIS — M329 Systemic lupus erythematosus, unspecified: Secondary | ICD-10-CM | POA: Diagnosis present

## 2013-11-06 DIAGNOSIS — Z85038 Personal history of other malignant neoplasm of large intestine: Secondary | ICD-10-CM

## 2013-11-06 DIAGNOSIS — E119 Type 2 diabetes mellitus without complications: Secondary | ICD-10-CM

## 2013-11-06 DIAGNOSIS — N058 Unspecified nephritic syndrome with other morphologic changes: Secondary | ICD-10-CM | POA: Diagnosis present

## 2013-11-06 DIAGNOSIS — N10 Acute tubulo-interstitial nephritis: Principal | ICD-10-CM | POA: Diagnosis present

## 2013-11-06 DIAGNOSIS — D509 Iron deficiency anemia, unspecified: Secondary | ICD-10-CM | POA: Diagnosis present

## 2013-11-06 DIAGNOSIS — E785 Hyperlipidemia, unspecified: Secondary | ICD-10-CM

## 2013-11-06 DIAGNOSIS — Z9049 Acquired absence of other specified parts of digestive tract: Secondary | ICD-10-CM

## 2013-11-06 DIAGNOSIS — Z8679 Personal history of other diseases of the circulatory system: Secondary | ICD-10-CM

## 2013-11-06 DIAGNOSIS — Z923 Personal history of irradiation: Secondary | ICD-10-CM

## 2013-11-06 DIAGNOSIS — Z794 Long term (current) use of insulin: Secondary | ICD-10-CM

## 2013-11-06 DIAGNOSIS — D649 Anemia, unspecified: Secondary | ICD-10-CM

## 2013-11-06 DIAGNOSIS — Z85819 Personal history of malignant neoplasm of unspecified site of lip, oral cavity, and pharynx: Secondary | ICD-10-CM

## 2013-11-06 DIAGNOSIS — I2510 Atherosclerotic heart disease of native coronary artery without angina pectoris: Secondary | ICD-10-CM | POA: Diagnosis present

## 2013-11-06 DIAGNOSIS — I509 Heart failure, unspecified: Secondary | ICD-10-CM

## 2013-11-06 DIAGNOSIS — E162 Hypoglycemia, unspecified: Secondary | ICD-10-CM

## 2013-11-06 DIAGNOSIS — E861 Hypovolemia: Secondary | ICD-10-CM | POA: Diagnosis present

## 2013-11-06 DIAGNOSIS — E876 Hypokalemia: Secondary | ICD-10-CM

## 2013-11-06 DIAGNOSIS — G894 Chronic pain syndrome: Secondary | ICD-10-CM

## 2013-11-06 DIAGNOSIS — I4891 Unspecified atrial fibrillation: Secondary | ICD-10-CM | POA: Diagnosis present

## 2013-11-06 DIAGNOSIS — N390 Urinary tract infection, site not specified: Secondary | ICD-10-CM

## 2013-11-06 DIAGNOSIS — I10 Essential (primary) hypertension: Secondary | ICD-10-CM

## 2013-11-06 DIAGNOSIS — E871 Hypo-osmolality and hyponatremia: Secondary | ICD-10-CM

## 2013-11-06 DIAGNOSIS — E1149 Type 2 diabetes mellitus with other diabetic neurological complication: Secondary | ICD-10-CM

## 2013-11-06 DIAGNOSIS — E860 Dehydration: Secondary | ICD-10-CM

## 2013-11-06 DIAGNOSIS — Z7901 Long term (current) use of anticoagulants: Secondary | ICD-10-CM

## 2013-11-06 DIAGNOSIS — I959 Hypotension, unspecified: Secondary | ICD-10-CM | POA: Diagnosis present

## 2013-11-06 DIAGNOSIS — F1010 Alcohol abuse, uncomplicated: Secondary | ICD-10-CM | POA: Diagnosis present

## 2013-11-06 DIAGNOSIS — IMO0002 Reserved for concepts with insufficient information to code with codable children: Secondary | ICD-10-CM

## 2013-11-06 DIAGNOSIS — E1142 Type 2 diabetes mellitus with diabetic polyneuropathy: Secondary | ICD-10-CM | POA: Diagnosis present

## 2013-11-06 HISTORY — DX: Heart failure, unspecified: I50.9

## 2013-11-06 HISTORY — DX: Essential (primary) hypertension: I10

## 2013-11-06 HISTORY — DX: Type 2 diabetes mellitus without complications: E11.9

## 2013-11-06 HISTORY — DX: Malignant (primary) neoplasm, unspecified: C80.1

## 2013-11-06 HISTORY — DX: Atherosclerotic heart disease of native coronary artery without angina pectoris: I25.10

## 2013-11-06 LAB — CBC WITH DIFFERENTIAL/PLATELET
BASOS ABS: 0 10*3/uL (ref 0.0–0.1)
BASOS PCT: 0 % (ref 0–1)
EOS ABS: 0.3 10*3/uL (ref 0.0–0.7)
Eosinophils Relative: 3 % (ref 0–5)
HCT: 30.1 % — ABNORMAL LOW (ref 39.0–52.0)
Hemoglobin: 9.7 g/dL — ABNORMAL LOW (ref 13.0–17.0)
Lymphocytes Relative: 4 % — ABNORMAL LOW (ref 12–46)
Lymphs Abs: 0.3 10*3/uL — ABNORMAL LOW (ref 0.7–4.0)
MCH: 26.6 pg (ref 26.0–34.0)
MCHC: 32.2 g/dL (ref 30.0–36.0)
MCV: 82.5 fL (ref 78.0–100.0)
MONO ABS: 0.4 10*3/uL (ref 0.1–1.0)
Monocytes Relative: 6 % (ref 3–12)
NEUTROS ABS: 6.8 10*3/uL (ref 1.7–7.7)
NEUTROS PCT: 87 % — AB (ref 43–77)
Platelets: 242 10*3/uL (ref 150–400)
RBC: 3.65 MIL/uL — ABNORMAL LOW (ref 4.22–5.81)
RDW: 14.8 % (ref 11.5–15.5)
WBC: 7.9 10*3/uL (ref 4.0–10.5)

## 2013-11-06 LAB — URINALYSIS, ROUTINE W REFLEX MICROSCOPIC
BILIRUBIN URINE: NEGATIVE
GLUCOSE, UA: NEGATIVE mg/dL
KETONES UR: NEGATIVE mg/dL
Nitrite: NEGATIVE
PH: 6.5 (ref 5.0–8.0)
PROTEIN: 30 mg/dL — AB
Specific Gravity, Urine: 1.01 (ref 1.005–1.030)
Urobilinogen, UA: 0.2 mg/dL (ref 0.0–1.0)

## 2013-11-06 LAB — URINE MICROSCOPIC-ADD ON

## 2013-11-06 LAB — COMPREHENSIVE METABOLIC PANEL
ALBUMIN: 2.8 g/dL — AB (ref 3.5–5.2)
ALT: 14 U/L (ref 0–53)
AST: 41 U/L — AB (ref 0–37)
Alkaline Phosphatase: 41 U/L (ref 39–117)
BILIRUBIN TOTAL: 0.4 mg/dL (ref 0.3–1.2)
BUN: 15 mg/dL (ref 6–23)
CHLORIDE: 85 meq/L — AB (ref 96–112)
CO2: 29 mEq/L (ref 19–32)
CREATININE: 1.17 mg/dL (ref 0.50–1.35)
Calcium: 9.1 mg/dL (ref 8.4–10.5)
GFR calc Af Amer: 72 mL/min — ABNORMAL LOW (ref >90)
GFR calc non Af Amer: 62 mL/min — ABNORMAL LOW (ref >90)
Glucose, Bld: 56 mg/dL — ABNORMAL LOW (ref 70–99)
POTASSIUM: 3.4 meq/L — AB (ref 3.7–5.3)
Sodium: 126 mEq/L — ABNORMAL LOW (ref 137–147)
TOTAL PROTEIN: 5.8 g/dL — AB (ref 6.0–8.3)

## 2013-11-06 LAB — DIGOXIN LEVEL: Digoxin Level: 1.7 ng/mL (ref 0.8–2.0)

## 2013-11-06 LAB — GLUCOSE, CAPILLARY: Glucose-Capillary: 134 mg/dL — ABNORMAL HIGH (ref 70–99)

## 2013-11-06 LAB — CBG MONITORING, ED
GLUCOSE-CAPILLARY: 82 mg/dL (ref 70–99)
GLUCOSE-CAPILLARY: 99 mg/dL (ref 70–99)
Glucose-Capillary: 133 mg/dL — ABNORMAL HIGH (ref 70–99)

## 2013-11-06 LAB — MAGNESIUM: MAGNESIUM: 1.8 mg/dL (ref 1.5–2.5)

## 2013-11-06 LAB — I-STAT CG4 LACTIC ACID, ED: Lactic Acid, Venous: 1.89 mmol/L (ref 0.5–2.2)

## 2013-11-06 MED ORDER — ONDANSETRON HCL 4 MG PO TABS: 4.0000 mg | ORAL_TABLET | Freq: Four times a day (QID) | ORAL | Status: DC | PRN

## 2013-11-06 MED ORDER — HYDROXYZINE HCL 25 MG PO TABS: 25.0000 mg | ORAL_TABLET | Freq: Three times a day (TID) | ORAL | Status: DC | PRN

## 2013-11-06 MED ORDER — DIGOXIN 125 MCG PO TABS
0.2500 mg | ORAL_TABLET | Freq: Every day | ORAL | Status: DC
  Administered 2013-11-07 – 2013-11-08 (×2): 0.25 mg via ORAL
  Filled 2013-11-06: qty 2
  Filled 2013-11-06: qty 1

## 2013-11-06 MED ORDER — LORATADINE 10 MG PO TABS
10.0000 mg | ORAL_TABLET | Freq: Every day | ORAL | Status: DC
  Administered 2013-11-07 – 2013-11-08 (×2): 10 mg via ORAL
  Filled 2013-11-06 (×2): qty 1

## 2013-11-06 MED ORDER — OXYCODONE-ACETAMINOPHEN 5-325 MG PO TABS
1.0000 | ORAL_TABLET | Freq: Once | ORAL | Status: AC
Start: 2013-11-06 — End: 2013-11-06
  Administered 2013-11-06: 1 via ORAL
  Filled 2013-11-06: qty 1

## 2013-11-06 MED ORDER — SODIUM CHLORIDE 0.9 % IV BOLUS (SEPSIS)
250.0000 mL | Freq: Once | INTRAVENOUS | Status: AC
  Administered 2013-11-06: 250 mL via INTRAVENOUS

## 2013-11-06 MED ORDER — CEPHALEXIN 500 MG PO CAPS: 500.0000 mg | ORAL_CAPSULE | Freq: Four times a day (QID) | ORAL | Status: DC

## 2013-11-06 MED ORDER — ONDANSETRON HCL 4 MG/2ML IJ SOLN: 4.0000 mg | Freq: Four times a day (QID) | INTRAMUSCULAR | Status: DC | PRN

## 2013-11-06 MED ORDER — FAMOTIDINE 20 MG PO TABS
20.0000 mg | ORAL_TABLET | Freq: Two times a day (BID) | ORAL | Status: DC
Start: 2013-11-06 — End: 2013-11-08
  Administered 2013-11-06 – 2013-11-08 (×4): 20 mg via ORAL
  Filled 2013-11-06 (×4): qty 1

## 2013-11-06 MED ORDER — ACETAMINOPHEN 650 MG RE SUPP
650.0000 mg | Freq: Four times a day (QID) | RECTAL | Status: DC | PRN
Start: 2013-11-06 — End: 2013-11-08

## 2013-11-06 MED ORDER — LORAZEPAM 2 MG/ML IJ SOLN
1.0000 mg | Freq: Four times a day (QID) | INTRAMUSCULAR | Status: DC | PRN
  Administered 2013-11-07: 1 mg via INTRAVENOUS
  Filled 2013-11-06: qty 1

## 2013-11-06 MED ORDER — ADULT MULTIVITAMIN W/MINERALS CH
1.0000 | ORAL_TABLET | Freq: Every day | ORAL | Status: DC
  Administered 2013-11-06 – 2013-11-08 (×3): 1 via ORAL
  Filled 2013-11-06 (×3): qty 1

## 2013-11-06 MED ORDER — VITAMIN B-1 100 MG PO TABS
100.0000 mg | ORAL_TABLET | Freq: Every day | ORAL | Status: DC
  Administered 2013-11-06 – 2013-11-08 (×3): 100 mg via ORAL
  Filled 2013-11-06 (×3): qty 1

## 2013-11-06 MED ORDER — GABAPENTIN 300 MG PO CAPS
300.0000 mg | ORAL_CAPSULE | Freq: Three times a day (TID) | ORAL | Status: DC
  Administered 2013-11-06 – 2013-11-08 (×5): 300 mg via ORAL
  Filled 2013-11-06 (×5): qty 1

## 2013-11-06 MED ORDER — ACETAMINOPHEN 325 MG PO TABS
650.0000 mg | ORAL_TABLET | Freq: Four times a day (QID) | ORAL | Status: DC | PRN
Start: 2013-11-06 — End: 2013-11-08
  Administered 2013-11-07 – 2013-11-08 (×2): 650 mg via ORAL
  Filled 2013-11-06 (×2): qty 2

## 2013-11-06 MED ORDER — ENOXAPARIN SODIUM 60 MG/0.6ML ~~LOC~~ SOLN
60.0000 mg | SUBCUTANEOUS | Status: DC
  Administered 2013-11-06 – 2013-11-07 (×2): 60 mg via SUBCUTANEOUS
  Filled 2013-11-06 (×2): qty 0.6

## 2013-11-06 MED ORDER — SODIUM CHLORIDE 0.9 % IV SOLN
INTRAVENOUS | Status: DC
  Administered 2013-11-06 – 2013-11-07 (×2): via INTRAVENOUS

## 2013-11-06 MED ORDER — LORAZEPAM 1 MG PO TABS: 1.0000 mg | ORAL_TABLET | Freq: Four times a day (QID) | ORAL | Status: DC | PRN

## 2013-11-06 MED ORDER — HYDROXYZINE PAMOATE 25 MG PO CAPS: 25.0000 mg | ORAL_CAPSULE | Freq: Three times a day (TID) | ORAL | Status: DC | PRN

## 2013-11-06 MED ORDER — INSULIN ASPART 100 UNIT/ML ~~LOC~~ SOLN: 0.0000 [IU] | Freq: Every day | SUBCUTANEOUS | Status: DC

## 2013-11-06 MED ORDER — CITALOPRAM HYDROBROMIDE 20 MG PO TABS
10.0000 mg | ORAL_TABLET | Freq: Every day | ORAL | Status: DC
  Administered 2013-11-07 – 2013-11-08 (×2): 10 mg via ORAL
  Filled 2013-11-06 (×2): qty 1

## 2013-11-06 MED ORDER — PREDNISONE 10 MG PO TABS
10.0000 mg | ORAL_TABLET | Freq: Every day | ORAL | Status: DC
  Administered 2013-11-07 – 2013-11-08 (×2): 10 mg via ORAL
  Filled 2013-11-06 (×2): qty 1

## 2013-11-06 MED ORDER — SODIUM CHLORIDE 0.9 % IJ SOLN
3.0000 mL | Freq: Two times a day (BID) | INTRAMUSCULAR | Status: DC
  Administered 2013-11-06 – 2013-11-07 (×3): 3 mL via INTRAVENOUS

## 2013-11-06 MED ORDER — VITAMIN B-12 1000 MCG PO TABS
1000.0000 ug | ORAL_TABLET | Freq: Every day | ORAL | Status: DC
  Administered 2013-11-07 – 2013-11-08 (×2): 1000 ug via ORAL
  Filled 2013-11-06 (×2): qty 1

## 2013-11-06 MED ORDER — POTASSIUM CHLORIDE CRYS ER 20 MEQ PO TBCR
40.0000 meq | EXTENDED_RELEASE_TABLET | Freq: Once | ORAL | Status: AC
  Administered 2013-11-06: 40 meq via ORAL
  Filled 2013-11-06: qty 2

## 2013-11-06 MED ORDER — THIAMINE HCL 100 MG/ML IJ SOLN
100.0000 mg | Freq: Every day | INTRAMUSCULAR | Status: DC
  Filled 2013-11-06: qty 2

## 2013-11-06 MED ORDER — MYCOPHENOLATE MOFETIL 250 MG PO CAPS
ORAL_CAPSULE | ORAL | Status: AC
  Filled 2013-11-06: qty 7

## 2013-11-06 MED ORDER — INSULIN ASPART 100 UNIT/ML ~~LOC~~ SOLN
0.0000 [IU] | Freq: Three times a day (TID) | SUBCUTANEOUS | Status: DC
  Administered 2013-11-07: 3 [IU] via SUBCUTANEOUS
  Administered 2013-11-08: 2 [IU] via SUBCUTANEOUS

## 2013-11-06 MED ORDER — NYSTATIN 100000 UNIT/GM EX OINT
TOPICAL_OINTMENT | CUTANEOUS | Status: AC
  Filled 2013-11-06: qty 15

## 2013-11-06 MED ORDER — NYSTATIN 100000 UNIT/GM EX OINT
1.0000 "application " | TOPICAL_OINTMENT | Freq: Three times a day (TID) | CUTANEOUS | Status: DC
  Administered 2013-11-06 – 2013-11-08 (×5): 1 via TOPICAL
  Filled 2013-11-06: qty 15

## 2013-11-06 MED ORDER — DEXTROSE 5 % IV SOLN
1.0000 g | Freq: Once | INTRAVENOUS | Status: AC
  Administered 2013-11-06: 1 g via INTRAVENOUS
  Filled 2013-11-06: qty 10

## 2013-11-06 MED ORDER — MICONAZOLE NITRATE 2 % EX POWD
1.0000 "application " | Freq: Two times a day (BID) | CUTANEOUS | Status: DC
  Filled 2013-11-06 (×19): qty 1

## 2013-11-06 MED ORDER — OXYBUTYNIN CHLORIDE 5 MG PO TABS
5.0000 mg | ORAL_TABLET | Freq: Three times a day (TID) | ORAL | Status: DC
  Administered 2013-11-06 – 2013-11-08 (×5): 5 mg via ORAL
  Filled 2013-11-06 (×5): qty 1

## 2013-11-06 MED ORDER — DEXTROSE 5 % IV SOLN
1.0000 g | INTRAVENOUS | Status: DC
  Administered 2013-11-07: 1 g via INTRAVENOUS
  Filled 2013-11-06 (×2): qty 10

## 2013-11-06 MED ORDER — FENOFIBRATE 160 MG PO TABS
160.0000 mg | ORAL_TABLET | Freq: Every day | ORAL | Status: DC
  Administered 2013-11-07 – 2013-11-08 (×2): 160 mg via ORAL
  Filled 2013-11-06 (×4): qty 1

## 2013-11-06 MED ORDER — CLOBETASOL PROPIONATE 0.05 % EX OINT
1.0000 "application " | TOPICAL_OINTMENT | Freq: Two times a day (BID) | CUTANEOUS | Status: DC
  Administered 2013-11-07 – 2013-11-08 (×3): 1 via TOPICAL
  Filled 2013-11-06: qty 15

## 2013-11-06 MED ORDER — MYCOPHENOLATE MOFETIL 250 MG PO CAPS
1750.0000 mg | ORAL_CAPSULE | Freq: Two times a day (BID) | ORAL | Status: DC
  Administered 2013-11-06 – 2013-11-08 (×4): 1750 mg via ORAL
  Filled 2013-11-06 (×8): qty 7

## 2013-11-06 MED ORDER — FOLIC ACID 1 MG PO TABS
1.0000 mg | ORAL_TABLET | Freq: Every day | ORAL | Status: DC
  Administered 2013-11-06 – 2013-11-08 (×3): 1 mg via ORAL
  Filled 2013-11-06 (×3): qty 1

## 2013-11-06 MED ORDER — INSULIN GLARGINE 100 UNIT/ML ~~LOC~~ SOLN
15.0000 [IU] | Freq: Every day | SUBCUTANEOUS | Status: DC
  Administered 2013-11-06 – 2013-11-07 (×2): 15 [IU] via SUBCUTANEOUS
  Filled 2013-11-06 (×3): qty 0.15

## 2013-11-06 MED ORDER — ONDANSETRON 8 MG PO TBDP
8.0000 mg | ORAL_TABLET | Freq: Once | ORAL | Status: AC
  Administered 2013-11-06: 8 mg via ORAL
  Filled 2013-11-06: qty 1

## 2013-11-06 NOTE — Discharge Instructions (Signed)
PLEASE HOLD ALL DIABETIC MEDICATIONS TODAY CHECK SUGARS FREQUENTLY IF SUGARS ARE INCREASING TOMORROW RESTART YOUR DIABETICS MEDICATIONS (GLUCOPHAGE, LANTUS)

## 2013-11-06 NOTE — ED Notes (Signed)
Pt given juice and water to drink.

## 2013-11-06 NOTE — ED Notes (Signed)
EDP aware of BP. 

## 2013-11-06 NOTE — Care Management Note (Signed)
Patient was noted to not have a PCP listed, but per patient PCP is Dr Tammi Klippel at [facility]  . Entered this information into computer.

## 2013-11-06 NOTE — H&P (Addendum)
[facility] History and Physical  EVEN BUDLONG POE:[id number] DOB: [birth year] DOA: 11/06/2013  Referring physician: Dr Christy Gentles PCP: Dr Tammi Klippel at [facility]  Chief Complaint:  Dysuria x 1 day  HPI:  68 y/o male with hx of diastolic CHF, Afib ( off coumadin), hx of colon ca s/p colectomy and chemotherapy, hx of oral Cancer , HTN, iron deficiency, CAD , DM with peripheral neuropathy and bed bound with chronic foley, Etoh abuse, was brought to the ED by his wife as patient complained of dysuria since yesterday. Patient had 2 episodes of nausea and vomiting yesterday. Since then he was comparing of dysuria and some suprapubic tenderness. Patient denies headache, dizziness, fever, chills, nausea , vomiting, chest pain, palpitations, SOB,  bowel  symptoms. Denies change in weight or appetite. At baseline he is bedbound. Wife reports his foley was changed only 4 days back.  Course in ED Patient had low grade temperature. While in ED BP dropped to [id number]. He was started on IV NS and improved to 100/57 MMHG.  Blood work showed hb of 9.7. Chemistry showed Na of  126, , k of 3.4, cl of 85 and blood glucose of 56.  UA was suggestive of UTI. Digoxin level and lactate were normal. Patient given IV rocephin and [facility] called for admission.   Review of Systems:  Constitutional: Denies fever, chills, diaphoresis, appetite change and fatigue.  HEENT: Denies photophobia, eye pain, redness, hearing loss, ear pain, congestion, sore throat, rhinorrhea, sneezing, mouth sores, trouble swallowing, neck pain, neck stiffness and tinnitus.   Respiratory: Denies SOB, DOE, cough, chest tightness,  and wheezing.   Cardiovascular: Denies chest pain, palpitations and leg swelling.  Gastrointestinal:  nausea, vomiting, abdominal pain, denies diarrhea, constipation, blood in stool and abdominal distention.  Genitourinary:  Dysuria, denies  urgency, frequency, hematuria, flank pain and difficulty urinating.   Endocrine: Denies: hot or cold intolerance, polyuria, polydipsia. Musculoskeletal: Denies myalgias, back pain, joint swelling, arthralgias and gait problem.  Skin: Denies pallor, rash and wound.  Neurological: Denies dizziness, seizures, syncope, weakness, light-headedness, numbness and headaches.  Psychiatric/Behavioral: Denies , mood changes, confusion, nervousness, sleep disturbance and agitation   Past Medical History  Diagnosis Date  . Cancer     colon and mouth  . CHF (congestive heart failure)   . Coronary artery disease   . Diabetes mellitus without complication   . Hypertension    Past Surgical History  Procedure Laterality Date  . Colon surgery  2005  . Mouth surgery      mouth cancer   Social History:  reports that he has never smoked. He does not have any smokeless tobacco history on file. He reports that he drinks about 6 - 7.2 ounces of alcohol per week. His drug history is not on file.  No Known Allergies  History reviewed. No pertinent family history.  Prior to Admission medications   Medication Sig Start Date End Date Taking? Authorizing Provider  citalopram (CELEXA) 20 MG tablet Take 10 mg by mouth daily.   Yes Historical Provider, MD  clobetasol ointment (TEMOVATE) 7.61 % Apply 1 application topically 2 (two) times daily.   Yes Historical Provider, MD  digoxin (LANOXIN) 0.25 MG tablet Take 0.25 mg by mouth daily.   Yes Historical Provider, MD  doxycycline (VIBRA-TABS) 100 MG tablet Take 100 mg by mouth 2 (two) times daily.   Yes Historical Provider, MD  fenofibrate (TRICOR) 145 MG tablet Take 145 mg by mouth daily.   Yes Historical Provider, MD  furosemide (LASIX) 40 MG tablet Take 40 mg by mouth daily.   Yes Historical Provider, MD  gabapentin (NEURONTIN) 300 MG capsule Take 300 mg by mouth 3 (three) times daily.   Yes Historical Provider, MD  HYDROcodone-acetaminophen (NORCO) 10-325 MG per tablet Take 1 tablet by mouth 2 (two) times daily as needed for  moderate pain.   Yes Historical Provider, MD  hydrOXYzine (VISTARIL) 25 MG capsule Take 25 mg by mouth every 8 (eight) hours as needed for itching.   Yes Historical Provider, MD  insulin glargine (LANTUS) 100 UNIT/ML injection Inject 30 Units into the skin daily.   Yes Historical Provider, MD  insulin regular (NOVOLIN R,HUMULIN R) 100 units/mL injection Inject 20 Units into the skin 3 (three) times daily before meals.   Yes Historical Provider, MD  lidocaine (XYLOCAINE) 2 % jelly Apply 1 application topically as needed (as directed for catheter change).   Yes Historical Provider, MD  lisinopril (PRINIVIL,ZESTRIL) 10 MG tablet Take 5 mg by mouth daily.   Yes Historical Provider, MD  loratadine (CLARITIN) 10 MG tablet Take 10 mg by mouth daily.   Yes Historical Provider, MD  metFORMIN (GLUCOPHAGE) 1000 MG tablet Take 1,000 mg by mouth 2 (two) times daily with a meal.   Yes Historical Provider, MD  metoprolol succinate (TOPROL-XL) 50 MG 24 hr tablet Take 25 mg by mouth daily. Take with or immediately following a meal.   Yes Historical Provider, MD  miconazole (MICOTIN) 2 % powder Apply 1 application topically 2 (two) times daily.   Yes Historical Provider, MD  mycophenolate (CELLCEPT) 250 MG capsule Take 1,750 mg by mouth 2 (two) times daily.   Yes Historical Provider, MD  nystatin ointment (MYCOSTATIN) Apply 1 application topically 3 (three) times daily.   Yes Historical Provider, MD  oxybutynin (DITROPAN) 5 MG tablet Take 5 mg by mouth 3 (three) times daily.   Yes Historical Provider, MD  predniSONE (DELTASONE) 10 MG tablet Take 10 mg by mouth daily with breakfast.   Yes Historical Provider, MD  ranitidine (ZANTAC) 150 MG tablet Take 150 mg by mouth 2 (two) times daily.   Yes Historical Provider, MD  vitamin B-12 (CYANOCOBALAMIN) 1000 MCG tablet Take 1,000 mcg by mouth daily.   Yes Historical Provider, MD  cephALEXin (KEFLEX) 500 MG capsule Take 1 capsule (500 mg total) by mouth 4 (four) times daily.  11/06/13   [name], MD     Physical Exam:  Filed Vitals:   11/06/13 1415 11/06/13 1423 11/06/13 1500 11/06/13 1504  BP: 90/57 84/48 75/56  86/55  Pulse:      Temp:      TempSrc:      Resp:      Height:      Weight:      SpO2:        Constitutional: Vital signs reviewed.  Patient is an elderly male in NAD HEENT: no pallor, no icterus, dry oral mucosa,  Cardiovascular: S1&S2 irregularly,  no MRG Chest: CTAB, no wheezes, rales, or rhonchi Abdominal: Soft. Non tender, non-distended, bowel sounds are normal, no masses, organomegaly, or guarding present.  GU: no CVA tenderness, chronic foley Ext: warm, no edema, superficial skin breakdown at the buttock Neurological: A&O x3, sleepy.  non focal  Labs on Admission:  Basic Metabolic Panel:  Recent Labs Lab 11/06/13 1153  NA 126*  K 3.4*  CL 85*  CO2 29  GLUCOSE 56*  BUN 15  CREATININE 1.17  CALCIUM 9.1   Liver Function  Tests:  Recent Labs Lab 11/06/13 1153  AST 41*  ALT 14  ALKPHOS 41  BILITOT 0.4  PROT 5.8*  ALBUMIN 2.8*   No results found for this basename: LIPASE, AMYLASE,  in the last 168 hours No results found for this basename: AMMONIA,  in the last 168 hours CBC:  Recent Labs Lab 11/06/13 1153  WBC 7.9  NEUTROABS 6.8  HGB 9.7*  HCT 30.1*  MCV 82.5  PLT 242   Cardiac Enzymes: No results found for this basename: CKTOTAL, CKMB, CKMBINDEX, TROPONINI,  in the last 168 hours BNP: No components found with this basename: POCBNP,  CBG:  Recent Labs Lab 11/06/13 1359 11/06/13 1557  GLUCAP 82 99    Radiological Exams on Admission: No results found.  EKG: pending  Assessment/Plan  Principal Problem:   Complicated UTI (urinary tract infection)/ acute pyelonephritis Patient recently had his foley changed ( 4 days back). He reports dysuria and some LLQ pain.  Has low grade temp and no leucocytosis.  doesnot meet criteria for SIRS/ Sepsis Admit to stepdown given concern for early  sepsis. Patient given 1 dose IV rocephin in ED which I will continue. Follow blood cx and urine cx. Monitor I/O  Active Problems:    Hypotension ? Early sepsis vs dehydration. Hold BP meds. BP slowly improving in ED with IV fluids.     Chronic pain syndrome Resume home pain meds with close monitoring.    Hypertension As above. Hold BP     CHF Has hx of diastolic dysfn. Recent 2D echo not known. Appears hypovolemic clinically. Will monitor with fluids. Hold lasix. continue digoxin. Check level.     Atrial fibrillation On coumadin previously . Was discontinued  as patient had been drinking beer at home.     History of colon cancer S/p resection and chemotherapy in 2006. Has colonoscopy scheduled for next week.  Hx of oral cancer  no records in system. Pt on cellcept and prednisone which will be continued    Hyponatremia possibly secondary to dehydration. Check urine osm and urine  Na. Hold lasix. Monitor with fluids    Hypokalemia Replenish with kcl. Check mg    Anemia, iron deficiency Wife reports patient planned on iron supplements. Monitor for now.   Diabetes mellitus Noted for low fsg in ED. Hold metformin. Will reduce dose of lantus to 15 units and hold premeal aspart.  Place on SSI. Check A1C, monitor fsg.  Continue neurontin for peripheral neuropathy.  etoh abuse  patient reportedly drinks 12 pks beer daily. Will place on CIWA.  GERD  continue pepcid    Diet:diabetic   DVT prophylaxis: sq lovenox   Code Status: full code Family Communication: discussed with wife at bedside Disposition Plan: home once improved  Arshad Oberholzer [facility] Pager [phone]  Total time spent on admission :70 minutes  If 7PM-7AM, please contact night-coverage [email or web address] Password [facility] 11/06/2013, 3:59 PM

## 2013-11-06 NOTE — ED Notes (Signed)
Called [facility] for report, no answer.

## 2013-11-06 NOTE — ED Provider Notes (Signed)
CSN: [id number]     Arrival date & time 11/06/13  1118 History   This chart was scribed for Lawrence Cable, MD by Jenne Campus, ED Scribe. This patient was seen in room APA14/APA14 and the patient's care was started at 12:17 PM.   Chief Complaint  Patient presents with  . Dysuria     Patient is a 68 y.o. male presenting with dysuria. The history is provided by the patient and a significant other. No language interpreter was used.  Dysuria This is a new problem. The problem has been gradually worsening. Associated symptoms include abdominal pain. Nothing aggravates the symptoms. Nothing relieves the symptoms.    HPI Comments: Lawrence Mcknight is a 68 y.o. male with a foley cath in at baseline for decubitus ulcer and being non-ambulatory who presents to the Emergency Department complaining of 4 days of dysuria with associated urine color change, redness to the penis, abdominal pain and 2 episodes of emesis that occurred yesterday. Wife states that the new foley catheter was placed 4 days. Wife denies any recent falls. Wife gave pt one hydrocodone around 8 AM this morning.    Past Medical History  Diagnosis Date  . Cancer     colon and mouth  . CHF (congestive heart failure)   . Coronary artery disease   . Diabetes mellitus without complication   . Hypertension    Past Surgical History  Procedure Laterality Date  . Colon surgery  2005  . Mouth surgery      mouth cancer   History reviewed. No pertinent family history. History  Substance Use Topics  . Smoking status: Never Smoker   . Smokeless tobacco: Not on file  . Alcohol Use: 6.0 - 7.2 oz/week    10-12 Cans of beer per week    Review of Systems  Gastrointestinal: Positive for abdominal pain.  Genitourinary: Positive for dysuria.  All other systems reviewed and are negative.   Allergies  Review of patient's allergies indicates no known allergies.  Home Medications   Prior to Admission medications   Medication  Sig Start Date End Date Taking? Authorizing Provider  citalopram (CELEXA) 20 MG tablet Take 10 mg by mouth daily.   Yes Historical Provider, MD  clobetasol ointment (TEMOVATE) 1.02 % Apply 1 application topically 2 (two) times daily.   Yes Historical Provider, MD  digoxin (LANOXIN) 0.25 MG tablet Take 0.25 mg by mouth daily.   Yes Historical Provider, MD  doxycycline (VIBRA-TABS) 100 MG tablet Take 100 mg by mouth 2 (two) times daily.   Yes Historical Provider, MD  fenofibrate (TRICOR) 145 MG tablet Take 145 mg by mouth daily.   Yes Historical Provider, MD  furosemide (LASIX) 40 MG tablet Take 40 mg by mouth daily.   Yes Historical Provider, MD  gabapentin (NEURONTIN) 300 MG capsule Take 300 mg by mouth 3 (three) times daily.   Yes Historical Provider, MD  HYDROcodone-acetaminophen (NORCO) 10-325 MG per tablet Take 1 tablet by mouth 2 (two) times daily as needed for moderate pain.   Yes Historical Provider, MD  hydrOXYzine (VISTARIL) 25 MG capsule Take 25 mg by mouth every 8 (eight) hours as needed for itching.   Yes Historical Provider, MD  insulin glargine (LANTUS) 100 UNIT/ML injection Inject 30 Units into the skin daily.   Yes Historical Provider, MD  insulin regular (NOVOLIN R,HUMULIN R) 100 units/mL injection Inject 20 Units into the skin 3 (three) times daily before meals.   Yes Historical Provider, MD  lidocaine (XYLOCAINE) 2 % jelly Apply 1 application topically as needed (as directed for catheter change).   Yes Historical Provider, MD  lisinopril (PRINIVIL,ZESTRIL) 10 MG tablet Take 5 mg by mouth daily.   Yes Historical Provider, MD  loratadine (CLARITIN) 10 MG tablet Take 10 mg by mouth daily.   Yes Historical Provider, MD  metFORMIN (GLUCOPHAGE) 1000 MG tablet Take 1,000 mg by mouth 2 (two) times daily with a meal.   Yes Historical Provider, MD  metoprolol succinate (TOPROL-XL) 50 MG 24 hr tablet Take 25 mg by mouth daily. Take with or immediately following a meal.   Yes Historical  Provider, MD  miconazole (MICOTIN) 2 % powder Apply 1 application topically 2 (two) times daily.   Yes Historical Provider, MD  mycophenolate (CELLCEPT) 250 MG capsule Take 1,750 mg by mouth 2 (two) times daily.   Yes Historical Provider, MD  nystatin ointment (MYCOSTATIN) Apply 1 application topically 3 (three) times daily.   Yes Historical Provider, MD  oxybutynin (DITROPAN) 5 MG tablet Take 5 mg by mouth 3 (three) times daily.   Yes Historical Provider, MD  predniSONE (DELTASONE) 10 MG tablet Take 10 mg by mouth daily with breakfast.   Yes Historical Provider, MD  ranitidine (ZANTAC) 150 MG tablet Take 150 mg by mouth 2 (two) times daily.   Yes Historical Provider, MD  vitamin B-12 (CYANOCOBALAMIN) 1000 MCG tablet Take 1,000 mcg by mouth daily.   Yes Historical Provider, MD   Triage vitals: BP 114/54  Pulse 99  Temp(Src) 99.1 F (37.3 C) (Rectal)  Resp 21  Ht 6' (1.829 m)  Wt 249 lb (112.946 kg)  BMI 33.76 kg/m2  SpO2 96% BP 84/48  Pulse 100  Temp(Src) 99.1 F (37.3 C) (Rectal)  Resp 21  Ht 6' (1.829 m)  Wt 249 lb (112.946 kg)  BMI 33.76 kg/m2  SpO2 92%  Physical Exam  Nursing note and vitals reviewed.   CONSTITUTIONAL: Well developed/well nourished HEAD: Normocephalic/atraumatic EYES: EOMI/PERRL ENMT: Mucous membranes moist NECK: supple no meningeal signs CV: S1/S2 noted, no murmurs/rubs/gallops noted LUNGS: Lungs are clear to auscultation bilaterally, no apparent distress ABDOMEN: soft, nontender, no rebound or guarding GU: foley in place on arrival to the ER, no signs of trauma to penis NEURO: Pt is awake/alert, moves all extremitiesx4 EXTREMITIES: pulses normal, full ROM SKIN: warm, color normal PSYCH: no abnormalities of mood noted   ED Course  Procedures  CRITICAL CARE Performed by: Lawrence Mcknight Total critical care time: 31 Critical care time was exclusive of separately billable procedures and treating other patients. Critical care was necessary to  treat or prevent imminent or life-threatening deterioration. Critical care was time spent personally by me on the following activities: development of treatment plan with patient and/or surrogate as well as nursing, discussions with consultants, evaluation of patient's response to treatment, examination of patient, obtaining history from patient or surrogate, ordering and performing treatments and interventions, ordering and review of laboratory studies, ordering and review of radiographic studies, pulse oximetry and re-evaluation of patient's condition.   DIAGNOSTIC STUDIES: Oxygen Saturation is 96% on RA, normal by my interpretation.    COORDINATION OF CARE: 12:19 PM-Discussed treatment plan which includes labs/urinalysis  3:26 PM While monitored in the ED, pt noted to be dehydrated as well as hypoglycemic This improved with PO fluids and meal However his BP is worsening with hypotension down to 03J systolic He has h/o immunosuppression as he is cellcept and prednisone and high risk for worsening and possible  sepsis He has been given fluid bolus but will require gentle hydration due to CHF IV antibiotics have been ordered Pt will require admission to stepdown for further monitoring I have d/w Dr Clementeen Graham with [facility] Blood cultures have been sent.  Lactate normal I have checked on patient frequently and will need monitoring in hospital BP 84/48  Pulse 100  Temp(Src) 99.1 F (37.3 C) (Rectal)  Resp 21  Ht 6' (1.829 m)  Wt 249 lb (112.946 kg)  BMI 33.76 kg/m2  SpO2 92%   Medications  sodium chloride 0.9 % bolus 250 mL (250 mLs Intravenous New Bag/Given 11/06/13 1441)  cefTRIAXone (ROCEPHIN) 1 g in dextrose 5 % 50 mL IVPB (not administered)  ondansetron (ZOFRAN-ODT) disintegrating tablet 8 mg (8 mg Oral Given 11/06/13 1244)  oxyCODONE-acetaminophen (PERCOCET/ROXICET) 5-325 MG per tablet 1 tablet (1 tablet Oral Given 11/06/13 1244)  oxyCODONE-acetaminophen (PERCOCET/ROXICET) 5-325 MG per  tablet 1 tablet (1 tablet Oral Given 11/06/13 1400)     Labs Review Labs Reviewed  CBC WITH DIFFERENTIAL - Abnormal; Notable for the following:    RBC 3.65 (*)    Hemoglobin 9.7 (*)    HCT 30.1 (*)    Neutrophils Relative % 87 (*)    Lymphocytes Relative 4 (*)    Lymphs Abs 0.3 (*)    All other components within normal limits  URINE CULTURE  COMPREHENSIVE METABOLIC PANEL  URINALYSIS, ROUTINE W REFLEX MICROSCOPIC      MDM   Final diagnoses:  Complicated UTI (urinary tract infection)  Dehydration    Nursing notes including past medical history and social history reviewed and considered in documentation Labs/vital reviewed and considered   I personally performed the services described in this documentation, which was scribed in my presence. The recorded information has been reviewed and is accurate.        Lawrence Cable, MD 11/06/13 [phone]

## 2013-11-06 NOTE — ED Notes (Signed)
Pain with urination, has catheter, some redness noted on penis, low urine output

## 2013-11-06 NOTE — ED Notes (Signed)
hospitalist at bedside

## 2013-11-07 DIAGNOSIS — I509 Heart failure, unspecified: Secondary | ICD-10-CM

## 2013-11-07 DIAGNOSIS — D649 Anemia, unspecified: Secondary | ICD-10-CM

## 2013-11-07 DIAGNOSIS — E1149 Type 2 diabetes mellitus with other diabetic neurological complication: Secondary | ICD-10-CM

## 2013-11-07 DIAGNOSIS — I10 Essential (primary) hypertension: Secondary | ICD-10-CM

## 2013-11-07 DIAGNOSIS — E785 Hyperlipidemia, unspecified: Secondary | ICD-10-CM

## 2013-11-07 DIAGNOSIS — E876 Hypokalemia: Secondary | ICD-10-CM

## 2013-11-07 DIAGNOSIS — C029 Malignant neoplasm of tongue, unspecified: Secondary | ICD-10-CM

## 2013-11-07 LAB — HEMOGLOBIN A1C
HEMOGLOBIN A1C: 5.7 % — AB (ref <5.7)
Mean Plasma Glucose: 117 mg/dL — ABNORMAL HIGH (ref <117)

## 2013-11-07 LAB — CBC
HCT: 30.6 % — ABNORMAL LOW (ref 39.0–52.0)
Hemoglobin: 9.8 g/dL — ABNORMAL LOW (ref 13.0–17.0)
MCH: 27.1 pg (ref 26.0–34.0)
MCHC: 32 g/dL (ref 30.0–36.0)
MCV: 84.8 fL (ref 78.0–100.0)
Platelets: 288 10*3/uL (ref 150–400)
RBC: 3.61 MIL/uL — ABNORMAL LOW (ref 4.22–5.81)
RDW: 15 % (ref 11.5–15.5)
WBC: 6.5 10*3/uL (ref 4.0–10.5)

## 2013-11-07 LAB — BASIC METABOLIC PANEL
BUN: 17 mg/dL (ref 6–23)
CO2: 28 mEq/L (ref 19–32)
CREATININE: 1.22 mg/dL (ref 0.50–1.35)
Calcium: 8.9 mg/dL (ref 8.4–10.5)
Chloride: 90 mEq/L — ABNORMAL LOW (ref 96–112)
GFR calc Af Amer: 68 mL/min — ABNORMAL LOW (ref >90)
GFR calc non Af Amer: 59 mL/min — ABNORMAL LOW (ref >90)
GLUCOSE: 84 mg/dL (ref 70–99)
Potassium: 4.4 mEq/L (ref 3.7–5.3)
Sodium: 130 mEq/L — ABNORMAL LOW (ref 137–147)

## 2013-11-07 LAB — GLUCOSE, CAPILLARY
GLUCOSE-CAPILLARY: 79 mg/dL (ref 70–99)
GLUCOSE-CAPILLARY: 102 mg/dL — AB (ref 70–99)
GLUCOSE-CAPILLARY: 164 mg/dL — AB (ref 70–99)
GLUCOSE-CAPILLARY: 172 mg/dL — AB (ref 70–99)

## 2013-11-07 LAB — MRSA PCR SCREENING: MRSA by PCR: POSITIVE — AB

## 2013-11-07 LAB — TSH: TSH: 6.43 u[IU]/mL — ABNORMAL HIGH (ref 0.350–4.500)

## 2013-11-07 MED ORDER — CHLORHEXIDINE GLUCONATE CLOTH 2 % EX PADS: 6.0000 | MEDICATED_PAD | Freq: Every day | CUTANEOUS | Status: DC

## 2013-11-07 MED ORDER — SODIUM CHLORIDE 0.9 % IV SOLN
INTRAVENOUS | Status: DC
  Administered 2013-11-07 – 2013-11-08 (×2): via INTRAVENOUS

## 2013-11-07 MED ORDER — MUPIROCIN 2 % EX OINT
1.0000 "application " | TOPICAL_OINTMENT | Freq: Two times a day (BID) | CUTANEOUS | Status: DC
  Administered 2013-11-07 – 2013-11-08 (×3): 1 via NASAL
  Filled 2013-11-07: qty 22

## 2013-11-07 NOTE — Progress Notes (Signed)
[facility] PROGRESS NOTE  Lawrence Mcknight MVH:[id number] DOB: 02-18-1946 DOA: 11/06/2013 PCP: No primary provider on file.  Assessment/Plan: Complicated UTI (urinary tract infection)/ acute pyelonephritis  Patient recently had his foley changed (4 days back). He reports dysuria and some LLQ pain, associated low grade temp. doesnot meet criteria for SIRS/ Sepsis  -continue rocephin -follow urine cx's -Follow blood cx    Hypotension  ? Early sepsis vs dehydration and continue use of antihypertensive regimen. -BP improved and stable after IVF's -continue holding BP meds X 1 more day.  Chronic pain syndrome  Resume home pain meds with close monitoring.   Hypertension  Improved. -will resume antihypertensive slowly starting in am.  CHF  Has hx of diastolic dysfn. Recent 2D echo performed according to wife; records not available. -on admission was hypovolemic; volume status improving -continue holding lasix  Atrial fibrillation  On coumadin previously . Was discontinued as patient had been drinking beer at home and having issues with compliance. -HR stable. Will monitor  -continue digoxin  History of colon cancer  S/p resection and chemotherapy in 2006. Has colonoscopy scheduled for next week.   Hx of oral cancer  no records in system. Follow at [facility].  Hx of lupus nephritis Pt on cellcept and prednisone which will be continued   Hyponatremia  Possibly secondary to dehydration and continue use of diuretics -will hold lasix X1 more day -Na 130 today -patient chronic alcohol use might also play a role in hyponatremia  Hypokalemia  -Replenish with kcl.  -Mg WNL  Anemia, iron deficiency  Will start niferex BID at discharge  Diabetes mellitus and neuropathy Noted for low fsg in ED. Hold metformin while inpatient -Will continue reduced dose of lantus. -continue SSI.  -A1C 5.7  -Continue neurontin for peripheral neuropathy.   Etoh abuse  patient reportedly drinks  12 pks beer daily.  -Will continue on CIWA.   GERD  continue pepcid   Code Status: Full Family Communication: no family at bedside Disposition Plan: home when medically stable   Consultants:  None   Procedures:  See below for x-ray reports   Antibiotics:  Rocephin   HPI/Subjective: Feeling better, still with suprapubic pain on palpation; no fever and no N/V.  Objective: Filed Vitals:   11/07/13 0800  BP: 145/74  Pulse:   Temp: 97.5 F (36.4 C)  Resp:     Intake/Output Summary (Last 24 hours) at 11/07/13 1050 Last data filed at 11/07/13 0500  Gross per 24 hour  Intake 178.33 ml  Output    900 ml  Net -721.67 ml   Filed Weights   11/06/13 1134 11/06/13 2300 11/07/13 0500  Weight: 112.946 kg (249 lb) 107.1 kg (236 lb 1.8 oz) 107 kg (235 lb 14.3 oz)    Exam:   General:  NAD, afebrile and feeling better  Cardiovascular: no rubs or gallops  Respiratory: no wheezing or crackles  Abdomen: soft, tender to palpation suprapubic area and flanks; positive BS  Musculoskeletal: no edema or cyanosis  Data Reviewed: Basic Metabolic Panel:  Recent Labs Lab 11/06/13 1150 11/06/13 1153 11/07/13 0420  NA  --  126* 130*  K  --  3.4* 4.4  CL  --  85* 90*  CO2  --  29 28  GLUCOSE  --  56* 84  BUN  --  15 17  CREATININE  --  1.17 1.22  CALCIUM  --  9.1 8.9  MG 1.8  --   --    Liver  Function Tests:  Recent Labs Lab 11/06/13 1153  AST 41*  ALT 14  ALKPHOS 41  BILITOT 0.4  PROT 5.8*  ALBUMIN 2.8*   CBC:  Recent Labs Lab 11/06/13 1153 11/07/13 0420  WBC 7.9 6.5  NEUTROABS 6.8  --   HGB 9.7* 9.8*  HCT 30.1* 30.6*  MCV 82.5 84.8  PLT 242 288   Cardiac Enzymes: No results found for this basename: CKTOTAL, CKMB, CKMBINDEX, TROPONINI,  in the last 168 hours BNP (last 3 results) No results found for this basename: PROBNP,  in the last 8760 hours CBG:  Recent Labs Lab 11/06/13 1359 11/06/13 1557 11/06/13 1824 11/06/13 2155 11/07/13 0718   GLUCAP 82 99 133* 134* 79    Recent Results (from the past 240 hour(s))  CULTURE, BLOOD (ROUTINE X 2)     Status: None   Collection Time    11/06/13  2:27 PM      Result Value Ref Range Status   Specimen Description BLOOD LEFT ANTECUBITAL DRAWN BY RN [facility]   Final   Special Requests BOTTLES DRAWN AEROBIC AND ANAEROBIC [place]   Final   Culture NO GROWTH 1 DAY   Final   Report Status PENDING   Incomplete  CULTURE, BLOOD (ROUTINE X 2)     Status: None   Collection Time    11/06/13  2:32 PM      Result Value Ref Range Status   Specimen Description BLOOD RIGHT ANTECUBITAL   Final   Special Requests BOTTLES DRAWN AEROBIC AND ANAEROBIC [place]   Final   Culture NO GROWTH 1 DAY   Final   Report Status PENDING   Incomplete     Studies: No results found.  Scheduled Meds: . cefTRIAXone (ROCEPHIN)  IV  1 g Intravenous Q24H  . citalopram  10 mg Oral Daily  . clobetasol ointment  1 application Topical BID  . digoxin  0.25 mg Oral Daily  . enoxaparin (LOVENOX) injection  60 mg Subcutaneous Q24H  . famotidine  20 mg Oral BID  . fenofibrate  160 mg Oral Daily  . folic acid  1 mg Oral Daily  . gabapentin  300 mg Oral TID  . insulin aspart  0-15 Units Subcutaneous TID WC  . insulin aspart  0-5 Units Subcutaneous QHS  . insulin glargine  15 Units Subcutaneous QHS  . loratadine  10 mg Oral Daily  . miconazole  1 application Topical BID  . multivitamin with minerals  1 tablet Oral Daily  . mycophenolate  1,750 mg Oral BID  . nystatin ointment  1 application Topical TID  . oxybutynin  5 mg Oral TID  . predniSONE  10 mg Oral Q breakfast  . sodium chloride  3 mL Intravenous Q12H  . thiamine  100 mg Oral Daily   Or  . thiamine  100 mg Intravenous Daily  . vitamin B-12  1,000 mcg Oral Daily   Continuous Infusions: . sodium chloride 100 mL/hr at 11/07/13 0947    Principal Problem:   Complicated UTI (urinary tract infection) Active Problems:   DIABETES MELLITUS, TYPE II, CONTROLLED   DIABETIC  PERIPHERAL NEUROPATHY   HYPERLIPIDEMIA   Chronic pain syndrome   HYPERTENSION   CHF   ATRIAL FIBRILLATION, HX OF   History of colon cancer   Hyponatremia   Hypokalemia   Anemia   Hypotension   UTI (urinary tract infection)    Time spent: >30 minutes    [place] [facility] Pager [phone]. If  7PM-7AM, please contact night-coverage at [email or web address], password [facility] 11/07/2013, 10:50 AM  LOS: 1 day

## 2013-11-07 NOTE — Progress Notes (Signed)
Report called to V.Dildy,LPN. Patient transferred to 303 in bed in stable condition.

## 2013-11-07 NOTE — Progress Notes (Signed)
UR chart review completed.  

## 2013-11-07 NOTE — Progress Notes (Signed)
Called wife to update regarding transfer but no answer.

## 2013-11-07 NOTE — Care Management Note (Addendum)
    Page 1 of 1   11/08/2013     10:44:44 AM CARE MANAGEMENT NOTE 11/08/2013  Patient:  Lawrence Mcknight, Lawrence Mcknight   Account Number:  [id number]  Date Initiated:  11/07/2013  Documentation initiated by:  Theophilus Kinds  Subjective/Objective Assessment:   Pt admitted from home with UTI. Pt lives with his wife and will return home at discharge. Pt has PCP with [facility] but wife does not want to make decision at this point to transfer pt or not. Pts PCP is Dr. Ammie Dalton. Pt has hoyer lif,     Action/Plan:   hospital bed, w/c for home use. Pt has CNA twice a week and RN once a week make visits into the home. Will fax paperwork to [facility].   Anticipated DC Date:  11/10/2013   Anticipated DC Plan:  [place]  CM consult      Choice offered to / List presented to:             Status of service:  Completed, signed off Medicare Important Message given?   (If response is "NO", the following Medicare IM given date fields will be blank) Date Medicare IM given:   Date Additional Medicare IM given:    Discharge Disposition:  [place]  Per UR Regulation:    If discussed at Long Length of Stay Meetings, dates discussed:    Comments:  11/08/13 [place], RN BSN CM Pt and wife decline transfer to [facility]. Paperwork faxed with H&P.  11/07/13 [place], RN BSN CM

## 2013-11-08 DIAGNOSIS — E119 Type 2 diabetes mellitus without complications: Secondary | ICD-10-CM

## 2013-11-08 DIAGNOSIS — G894 Chronic pain syndrome: Secondary | ICD-10-CM

## 2013-11-08 DIAGNOSIS — Z85038 Personal history of other malignant neoplasm of large intestine: Secondary | ICD-10-CM

## 2013-11-08 LAB — GLUCOSE, CAPILLARY
Glucose-Capillary: 80 mg/dL (ref 70–99)
Glucose-Capillary: 143 mg/dL — ABNORMAL HIGH (ref 70–99)

## 2013-11-08 MED ORDER — CEPHALEXIN 500 MG PO CAPS: 500.0000 mg | ORAL_CAPSULE | Freq: Four times a day (QID) | ORAL | Status: AC

## 2013-11-08 MED ORDER — INSULIN REGULAR HUMAN 100 UNIT/ML IJ SOLN: 12.0000 [IU] | Freq: Three times a day (TID) | INTRAMUSCULAR | Status: AC

## 2013-11-08 MED ORDER — ADULT MULTIVITAMIN W/MINERALS CH: 1.0000 | ORAL_TABLET | Freq: Every day | ORAL | Status: AC

## 2013-11-08 MED ORDER — INSULIN GLARGINE 100 UNIT/ML ~~LOC~~ SOLN: 15.0000 [IU] | Freq: Every day | SUBCUTANEOUS | Status: AC

## 2013-11-08 NOTE — Discharge Summary (Signed)
Physician Discharge Summary  Lawrence Mcknight TKZ:[id number] DOB: 1946-06-28 DOA: 11/06/2013  PCP: No primary provider on file.  Admit date: 11/06/2013 Discharge date: 11/08/2013  Time spent: >30 minutes  Recommendations for Outpatient Follow-up:  -Basic metabolic panel to follow electrolytes and renal function -Reassess blood pressure and adjust medication as needed -Follow CBGs and diabetes closely and adjust his medications as needed to continue good blood sugar control without hypoglycemic events.  Discharge Diagnoses:  Principal Problem:   Complicated UTI (urinary tract infection) Active Problems:   DIABETES MELLITUS, TYPE II, CONTROLLED   DIABETIC PERIPHERAL NEUROPATHY   HYPERLIPIDEMIA   Chronic pain syndrome   HYPERTENSION   CHF   ATRIAL FIBRILLATION, HX OF   History of colon cancer   Hyponatremia   Hypokalemia   Anemia   Hypotension   UTI (urinary tract infection)   Discharge Condition: Stable and improved. Patient will be discharged home and has been advised to follow with PCP in 7-10 days.  Diet recommendation: Heart healthy-low-sodium and low carbohydrates.  Filed Weights   11/06/13 1134 11/06/13 2300 11/07/13 0500  Weight: 112.946 kg (249 lb) 107.1 kg (236 lb 1.8 oz) 107 kg (235 lb 14.3 oz)    History of present illness:  68 y/o male with hx of diastolic CHF, Afib ( off coumadin), hx of colon ca s/p colectomy and chemotherapy, hx of oral Cancer , HTN, iron deficiency, CAD , DM with peripheral neuropathy and bed bound with chronic foley, Etoh abuse, was brought to the ED by his wife as patient complained of dysuria since yesterday. Patient had 2 episodes of nausea and vomiting yesterday. Since then he was comparing of dysuria and some suprapubic tenderness. Patient denies headache, dizziness, fever, chills, nausea , vomiting, chest pain, palpitations, SOB, bowel symptoms. Denies change in weight or appetite. At baseline he is bedbound. Wife reports his foley was changed  only 4 days back.   Hospital Course:  Complicated UTI (urinary tract infection)/ acute pyelonephritis  Patient recently had his foley changed (4 days back). He reports dysuria and some LLQ pain on admission, associated low grade temp. Does not meet criteria for SIRS/ Sepsis  -will discharge on cephalexin for 1 more week -Follow blood cx's; but at discharge not growth   Hypotension  ? Early sepsis vs dehydration and continue use of antihypertensive regimen.  -BP improved and stable after IVF's  -Now the blood pressure is back to normal range/slightly elevated; will resume home antihypertensive regimen.  Chronic pain syndrome  Resume home pain meds with close monitoring.   Hypertension  Is stable at discharge. -Will resume home antihypertensive regimen.  CHF  Has hx of diastolic dysfn. Recent 2D echo performed according to wife; records not available.  -on admission was hypovolemic; volume status back to normal now after fluid resuscitation -Will resume diuretics and patient will be advised to follow a low-sodium diet and to check his weight every day   Atrial fibrillation  On coumadin previously . Was discontinued as patient had been drinking beer at home and having issues with compliance.  -HR remained stable. Will monitor  -continue digoxin   History of colon cancer  S/p resection and chemotherapy in 2006. Has colonoscopy scheduled for next week.   Hx of oral cancer  no records in system. Continue Follow up at the [place].   Hx of lupus nephritis  Pt on cellcept and prednisone which will be continued   Hyponatremia  Possibly secondary to dehydration and continue use of diuretics  -  will resume diuretics -Na 134 at discharge.  -patient chronic alcohol use might also play a role in hyponatremia  -Advised to stop drinking.  Hypokalemia  -Repleted -Mg WNL   Anemia, iron deficiency  Will start niferex BID at discharge   Diabetes mellitus and neuropathy  -Patient with  episode of hypoglycemia prior to admission and inside the hospital.  -Will discharge on reduced dose of lantus, will stop metformin and will also reduced novolin dosage -follow CBG's and fasting glucose in outpatient setting for further adjustments..  -A1C 5.7  -Continue neurontin for peripheral neuropathy.  -Patient advised to follow a low carbohydrate diets and to check his CBGs at least 3 times a day.  Etoh abuse  patient reportedly drinks 12 pks beer daily.  -No withdrawal signs/symptoms appreciated during this admission. -Patient has been advised to quit/stop drinking, wife was also present at bedside also instructed on how to coring down until the moment that the patient is not drinking more than 3 beers per week. -Will continue multivitamins to supply thiamine and folic acid.  GERD  continue pepcid BID   Procedures: See below for x-ray reports  Consultations:  None   Discharge Exam: Filed Vitals:   11/08/13 1515  BP: 143/72  Pulse: 79  Temp: 98.3 F (36.8 C)  Resp: 18   General: NAD, afebrile and feeling great and asking to be discharge. Denies dysuria, nausea, vomiting, CP or SOB  Cardiovascular: no rubs or gallops  Respiratory: no wheezing or crackles  Abdomen: soft, no tenderness; positive BS  Musculoskeletal: no edema or cyanosis    Discharge Instructions You were cared for by a hospitalist during your hospital stay. If you have any questions about your discharge medications or the care you received while you were in the hospital after you are discharged, you can call the unit and asked to speak with the hospitalist on call if the hospitalist that took care of you is not available. Once you are discharged, your primary care physician will handle any further medical issues. Please note that NO REFILLS for any discharge medications will be authorized once you are discharged, as it is imperative that you return to your primary care physician (or establish a  relationship with a primary care physician if you do not have one) for your aftercare needs so that they can reassess your need for medications and monitor your lab values.  Discharge Orders   Future Appointments Provider Department Dept Phone   11/17/2013 1:15 PM [place]   Future Orders Complete By Expires   Diet - low sodium heart healthy  As directed        Medication List    STOP taking these medications       doxycycline 100 MG tablet  Commonly known as:  VIBRA-TABS      TAKE these medications       cephALEXin 500 MG capsule  Commonly known as:  KEFLEX  Take 1 capsule (500 mg total) by mouth 4 (four) times daily.     citalopram 20 MG tablet  Commonly known as:  CELEXA  Take 10 mg by mouth daily.     clobetasol ointment 0.05 %  Commonly known as:  TEMOVATE  Apply 1 application topically 2 (two) times daily.     digoxin 0.25 MG tablet  Commonly known as:  LANOXIN  Take 0.25 mg by mouth daily.     fenofibrate 145 MG tablet  Commonly  known as:  TRICOR  Take 145 mg by mouth daily.     furosemide 40 MG tablet  Commonly known as:  LASIX  Take 40 mg by mouth daily.     gabapentin 300 MG capsule  Commonly known as:  NEURONTIN  Take 300 mg by mouth 3 (three) times daily.     HYDROcodone-acetaminophen 10-325 MG per tablet  Commonly known as:  NORCO  Take 1 tablet by mouth 2 (two) times daily as needed for moderate pain.     hydrOXYzine 25 MG capsule  Commonly known as:  VISTARIL  Take 25 mg by mouth every 8 (eight) hours as needed for itching.     insulin glargine 100 UNIT/ML injection  Commonly known as:  LANTUS  Inject 30 Units into the skin daily.     insulin regular 100 units/mL injection  Commonly known as:  NOVOLIN R,HUMULIN R  Inject 20 Units into the skin 3 (three) times daily before meals.     lidocaine 2 % jelly  Commonly known as:  XYLOCAINE  Apply 1 application topically as  needed (as directed for catheter change).     lisinopril 10 MG tablet  Commonly known as:  PRINIVIL,ZESTRIL  Take 5 mg by mouth daily.     loratadine 10 MG tablet  Commonly known as:  CLARITIN  Take 10 mg by mouth daily.     metFORMIN 1000 MG tablet  Commonly known as:  GLUCOPHAGE  Take 1,000 mg by mouth 2 (two) times daily with a meal.     metoprolol succinate 50 MG 24 hr tablet  Commonly known as:  TOPROL-XL  Take 25 mg by mouth daily. Take with or immediately following a meal.     miconazole 2 % powder  Commonly known as:  MICOTIN  Apply 1 application topically 2 (two) times daily.     multivitamin with minerals Tabs tablet  Take 1 tablet by mouth daily.     mycophenolate 250 MG capsule  Commonly known as:  CELLCEPT  Take 1,750 mg by mouth 2 (two) times daily.     nystatin ointment  Commonly known as:  MYCOSTATIN  Apply 1 application topically 3 (three) times daily.     oxybutynin 5 MG tablet  Commonly known as:  DITROPAN  Take 5 mg by mouth 3 (three) times daily.     predniSONE 10 MG tablet  Commonly known as:  DELTASONE  Take 10 mg by mouth daily with breakfast.     ranitidine 150 MG tablet  Commonly known as:  ZANTAC  Take 150 mg by mouth 2 (two) times daily.     vitamin B-12 1000 MCG tablet  Commonly known as:  CYANOCOBALAMIN  Take 1,000 mcg by mouth daily.       No Known Allergies    The results of significant diagnostics from this hospitalization (including imaging, microbiology, ancillary and laboratory) are listed below for reference.    Significant Diagnostic Studies: No results found.  Microbiology: Recent Results (from the past 240 hour(s))  URINE CULTURE     Status: None   Collection Time    11/06/13  1:06 PM      Result Value Ref Range Status   Specimen Description URINE, CATHETERIZED   Final   Special Requests NONE   Final   Culture  Setup Time     Final   Value: 11/07/2013 00:02     Performed at [place] PENDING  Incomplete   Culture     Final   Value: Culture reincubated for better growth     Performed at [facility]   Report Status PENDING   Incomplete  CULTURE, BLOOD (ROUTINE X 2)     Status: None   Collection Time    11/06/13  2:27 PM      Result Value Ref Range Status   Specimen Description BLOOD LEFT ANTECUBITAL DRAWN BY RN [facility]   Final   Special Requests BOTTLES DRAWN AEROBIC AND ANAEROBIC 6CC   Final   Culture NO GROWTH 2 DAYS   Final   Report Status PENDING   Incomplete  CULTURE, BLOOD (ROUTINE X 2)     Status: None   Collection Time    11/06/13  2:32 PM      Result Value Ref Range Status   Specimen Description BLOOD RIGHT ANTECUBITAL   Final   Special Requests BOTTLES DRAWN AEROBIC AND ANAEROBIC 8CC   Final   Culture NO GROWTH 2 DAYS   Final   Report Status PENDING   Incomplete  MRSA PCR SCREENING     Status: Abnormal   Collection Time    11/07/13  6:10 AM      Result Value Ref Range Status   MRSA by PCR POSITIVE (*) NEGATIVE Final   Comment:            The GeneXpert MRSA Assay (FDA     approved for NASAL specimens     only), is one component of a     comprehensive MRSA colonization     surveillance program. It is not     intended to diagnose MRSA     infection nor to guide or     monitor treatment for     MRSA infections.     RESULT CALLED TO, READ BACK BY AND VERIFIED WITH:     CHILDRESS,J. AT 1517 ON 11/07/2013 BY BAUGHAM,M.     Labs: Basic Metabolic Panel:  Recent Labs Lab 11/06/13 1150 11/06/13 1153 11/07/13 0420  NA  --  126* 130*  K  --  3.4* 4.4  CL  --  85* 90*  CO2  --  29 28  GLUCOSE  --  56* 84  BUN  --  15 17  CREATININE  --  1.17 1.22  CALCIUM  --  9.1 8.9  MG 1.8  --   --    Liver Function Tests:  Recent Labs Lab 11/06/13 1153  AST 41*  ALT 14  ALKPHOS 41  BILITOT 0.4  PROT 5.8*  ALBUMIN 2.8*   CBC:  Recent Labs Lab 11/06/13 1153 11/07/13 0420  WBC 7.9 6.5  NEUTROABS 6.8  --   HGB 9.7* 9.8*  HCT  30.1* 30.6*  MCV 82.5 84.8  PLT 242 288   CBG:  Recent Labs Lab 11/07/13 1114 11/07/13 1605 11/07/13 2129 11/08/13 0719 11/08/13 1116  GLUCAP 102* 164* 172* 80 143*    Signed:  Barton Dubois  [facility] 11/08/2013, 3:19 PM

## 2013-11-08 NOTE — Progress Notes (Signed)
Patient's wife states understanding of discharge instructions, prescriptions given.

## 2013-11-10 LAB — URINE CULTURE

## 2013-11-11 LAB — CULTURE, BLOOD (ROUTINE X 2)
CULTURE: NO GROWTH
Culture: NO GROWTH

## 2013-11-17 ENCOUNTER — Encounter (HOSPITAL_BASED_OUTPATIENT_CLINIC_OR_DEPARTMENT_OTHER): Payer: Medicare Other | Attending: General Surgery

## 2013-11-17 DIAGNOSIS — B358 Other dermatophytoses: Secondary | ICD-10-CM | POA: Insufficient documentation

## 2013-12-22 ENCOUNTER — Encounter (HOSPITAL_BASED_OUTPATIENT_CLINIC_OR_DEPARTMENT_OTHER): Payer: Medicare Other | Attending: General Surgery

## 2013-12-22 DIAGNOSIS — B369 Superficial mycosis, unspecified: Secondary | ICD-10-CM | POA: Insufficient documentation

## 2014-01-19 ENCOUNTER — Encounter (HOSPITAL_BASED_OUTPATIENT_CLINIC_OR_DEPARTMENT_OTHER): Payer: Medicare Other | Attending: General Surgery

## 2014-01-19 DIAGNOSIS — B358 Other dermatophytoses: Secondary | ICD-10-CM | POA: Insufficient documentation

## 2014-03-02 ENCOUNTER — Encounter (HOSPITAL_BASED_OUTPATIENT_CLINIC_OR_DEPARTMENT_OTHER): Payer: Medicare Other | Attending: General Surgery

## 2014-03-02 DIAGNOSIS — B358 Other dermatophytoses: Secondary | ICD-10-CM | POA: Diagnosis not present

## 2014-04-06 ENCOUNTER — Encounter (HOSPITAL_BASED_OUTPATIENT_CLINIC_OR_DEPARTMENT_OTHER): Payer: Medicare Other | Attending: General Surgery

## 2014-06-17 ENCOUNTER — Encounter (HOSPITAL_COMMUNITY): Payer: Self-pay | Admitting: Emergency Medicine

## 2014-06-17 ENCOUNTER — Emergency Department (HOSPITAL_COMMUNITY)
Admission: EM | Admit: 2014-06-17 | Discharge: 2014-06-17 | Disposition: A | Discharge status: Home / Self Care | Payer: Medicare Other | Attending: Emergency Medicine | Admitting: Emergency Medicine

## 2014-06-17 DIAGNOSIS — Z794 Long term (current) use of insulin: Secondary | ICD-10-CM | POA: Insufficient documentation

## 2014-06-17 DIAGNOSIS — Z7982 Long term (current) use of aspirin: Secondary | ICD-10-CM | POA: Insufficient documentation

## 2014-06-17 DIAGNOSIS — Z7952 Long term (current) use of systemic steroids: Secondary | ICD-10-CM | POA: Diagnosis not present

## 2014-06-17 DIAGNOSIS — Z85038 Personal history of other malignant neoplasm of large intestine: Secondary | ICD-10-CM | POA: Insufficient documentation

## 2014-06-17 DIAGNOSIS — Z85819 Personal history of malignant neoplasm of unspecified site of lip, oral cavity, and pharynx: Secondary | ICD-10-CM | POA: Insufficient documentation

## 2014-06-17 DIAGNOSIS — I10 Essential (primary) hypertension: Secondary | ICD-10-CM | POA: Insufficient documentation

## 2014-06-17 DIAGNOSIS — I509 Heart failure, unspecified: Secondary | ICD-10-CM | POA: Insufficient documentation

## 2014-06-17 DIAGNOSIS — I2510 Atherosclerotic heart disease of native coronary artery without angina pectoris: Secondary | ICD-10-CM | POA: Insufficient documentation

## 2014-06-17 DIAGNOSIS — Z792 Long term (current) use of antibiotics: Secondary | ICD-10-CM | POA: Diagnosis not present

## 2014-06-17 DIAGNOSIS — Z466 Encounter for fitting and adjustment of urinary device: Secondary | ICD-10-CM | POA: Diagnosis not present

## 2014-06-17 DIAGNOSIS — Z79899 Other long term (current) drug therapy: Secondary | ICD-10-CM | POA: Insufficient documentation

## 2014-06-17 DIAGNOSIS — E119 Type 2 diabetes mellitus without complications: Secondary | ICD-10-CM | POA: Diagnosis not present

## 2014-06-17 LAB — URINALYSIS, ROUTINE W REFLEX MICROSCOPIC
Bilirubin Urine: NEGATIVE
Glucose, UA: NEGATIVE mg/dL
HGB URINE DIPSTICK: NEGATIVE
Ketones, ur: NEGATIVE mg/dL
Nitrite: NEGATIVE
PROTEIN: NEGATIVE mg/dL
Urobilinogen, UA: 0.2 mg/dL (ref 0.0–1.0)
pH: 6 (ref 5.0–8.0)

## 2014-06-17 LAB — URINE MICROSCOPIC-ADD ON

## 2014-06-17 NOTE — Discharge Instructions (Signed)
°Emergency Department Resource Guide °1) Find a Doctor and Pay Out of Pocket °Although you won't have to find out who is covered by your insurance plan, it is a good idea to ask around and get recommendations. You will then need to call the office and see if the doctor you have chosen will accept you as a new patient and what types of options they offer for patients who are self-pay. Some doctors offer discounts or will set up payment plans for their patients who do not have insurance, but you will need to ask so you aren't surprised when you get to your appointment. ° °2) Contact Your Local Health Department °Not all health departments have doctors that can see patients for sick visits, but many do, so it is worth a call to see if yours does. If you don't know where your local health department is, you can check in your phone book. The CDC also has a tool to help you locate your state's health department, and many state websites also have listings of all of their local health departments. ° °3) Find a Walk-in Clinic °If your illness is not likely to be very severe or complicated, you may want to try a walk in clinic. These are popping up all over the country in pharmacies, drugstores, and shopping centers. They're usually staffed by nurse practitioners or physician assistants that have been trained to treat common illnesses and complaints. They're usually fairly quick and inexpensive. However, if you have serious medical issues or chronic medical problems, these are probably not your best option. ° °No Primary Care Doctor: °- Call [facility] at  832-8000 - they can help you locate a primary care doctor that  accepts your insurance, provides certain services, etc. °- Physician Referral Service- 1-800-533-3463 ° °Chronic Pain Problems: °Organization         Address  Phone   Notes  °[facility]  (336) 297-2271 Patients need to be referred by their primary care doctor.  ° °Medication  Assistance: °Organization         Address  Phone   Notes  °[place] [facility] [address] °[place], [place] [address] (336) 641-8030 --Must be a resident of [place] °-- Must have NO insurance coverage whatsoever (no Medicaid/ Medicare, etc.) °-- The pt. MUST have a primary care doctor that directs their care regularly and follows them in the community °  °[facility]  (866) 331-1348   °[employer]  (888) 892-1162   ° °Agencies that provide inexpensive medical care: °Organization         Address  Phone   Notes  °[facility]  (336) 832-8035   ° Internal Medicine    (336) 832-7272   °[facility] [address] °[place], [place] [address] (336) 832-4777   °[facility] [address], °[place] (336) 271-4999   °[facility]    (336) 373-0678   °[facility]    (336) 272-1050   °[facility] ° [address], [place] Phone:  (336) 832-4444, Fax:  (336) 832-4440 Hours of Operation:  9 am - 6 pm, M-F.  Also accepts Medicaid/Medicare and self-pay.  °[facility] ° [address], [place] Phone: (336) 832-3150, Fax: (336) [phone]. Hours of Operation:  8:30 am - 5:30 pm, M-F.  Also accepts Medicaid and self-pay.  °[facility] [place] [address], [place] Phone: (336) 878-6027   °[facility] [address], [place], [place] (336)723-1848, Ext. 123 Mondays & Thursdays: 7-9 AM.  First 15 patients are seen on a first come, first serve basis. °  ° °Medicaid-accepting [place] Providers: ° °Organization         Address  Phone   Notes  °[facility] [address], [place] (336) 641-2100 Also accepts self-pay patients.  °[facility] [address], [place] ° (336) 856-9996   °[facility] [address], [place]  (336) 288-8857   °[facility] [address] [place] [address], [place] (336) 299-7000   °Veita Bland [address], [place]  ° (336) 373-1557 Only accepts [place] Access Medicaid patients after they have their name applied to their card.  ° °Self-Pay (no insurance) in [place]: ° °Organization         Address  Phone   Notes  °Sickle Cell Patients, [facility] [address], [place] (336) 832-1970   °[facility] [address], [place] (336) 832-4400   °[facility] ° [address], [place] (336) 992-4800   °[facility]/Dr. Osei-Bonsu ° [address] [place] [address], [place] or [address], [place] (336) 841-8500 Phone number for both [place] and [place] locations is the same.  °[facility] [address], [place] (336) 299-0000   °[facility] [address] [place] [address], [place] or [address] (336) 852-7530 °(336) 878-2260   °[facility] [address], [place] (336) 350-1642, phone; (336) 294-5005, fax Sees patients 1st and 3rd Saturday of every month.  Must not qualify for public or private insurance (i.e. Medicaid, Medicare, [place] Health Choice, Veterans' Benefits) • Household income should be no more than 200% of the poverty level •The clinic cannot treat you if you are pregnant or think you are pregnant • Sexually transmitted diseases are not treated at the clinic.  ° ° °Dental Care: °Organization         Address  Phone  Notes  °[place] [facility] [address], [place] (336) 641-6152 Accepts children up to age 21 who are enrolled in Medicaid or [place] Health Choice; pregnant women with a Medicaid card; and children who have applied for Medicaid or [place] Health Choice, but were declined, whose parents can pay a reduced fee at time of service.  °[place]  [facility] [place]  [address], [place] (336) 641-7733 Accepts children up to age 21 who are enrolled in Medicaid or [place] Health Choice; pregnant women with a Medicaid card; and children who have applied for Medicaid or [place] Health Choice, but were declined, whose parents can pay a reduced fee at time of service.  °[facility] ° [address], [place] (336) 641-4533 Patients are seen by appointment only. Walk-ins are not accepted. [facility] will see patients 18 years of age and older. °Monday - Tuesday (8am-5pm) °Most Wednesdays (8:30-5pm) °$30 per visit, cash only  °[facility] ° [address], [place] (336) 641-4533 Patients are seen by appointment only. Walk-ins are not accepted. [facility] will see patients 18 years of age and older. °One   Wednesday Evening (Monthly: Volunteer Based).  $30 per visit, cash only  °[facility]  (919) 537-3737 for adults; Children under age 4, call Graduate Pediatric Dentistry at ([phone]) [phone]. Children aged 4-14, please call (919) [phone] to request a pediatric application. ° Dental services are provided in all areas of dental care including fillings, crowns and bridges, complete and partial dentures, implants, gum treatment, root canals, and extractions. Preventive care is also provided. Treatment is provided to both adults and children. °Patients are selected via a lottery and there is often a waiting list. °  °[facility] [address], °[place] ° (336) 763-8833 [email or web address] °  °[facility] [address], [place], [place] (336)723-1848, Ext. 123 Second and Fourth Thursday of each month, opens at 6:30 AM; Clinic ends at 9 AM.  Patients are seen on a first-come first-served basis, and a limited number are seen during each clinic.  ° °[facility] ° [address], [place], [place] (336) 723-7904    Eligibility Requirements °You must have lived in [place], [place], or [place] counties for at least the last three months. °  You cannot be eligible for state or federal sponsored healthcare insurance, including Veterans Administration, Medicaid, or Medicare. °  You generally cannot be eligible for healthcare insurance through your employer.  °  How to apply: °Eligibility screenings are held every Tuesday and Wednesday afternoon from 1:00 pm until 4:00 pm. You do not need an appointment for the interview!  °[facility] [address], [place], [place] 336-631-2330   °[facility]  336-342-8273   °[facility]  336-703-3100   °[facility]  336-570-6415   ° °Behavioral Health Resources in the Community: °Intensive Outpatient Programs °Organization         Address  Phone  Notes  °[facility] [address], [place], [place] 336-878-6098   °[facility] [address], [place], [place] 336-832-9800   °[facility] [address], [place], [place] ° 336-882-2125   °[facility] [address],  °[place], [place] 1-800-853-5163 or 336-641-4981   °Substance Abuse Resources °Organization         Address  Phone  Notes  °[facility]  336-882-2125   °[facility]  336-784-9470   °[facility]  336-285-9073   °[facility]  336-845-3988   °Residential & Outpatient Substance Abuse Program  1-800-659-3381   °Psychological Services °Organization         Address  Phone  Notes  °[facility]  336- 832-9600   °[facility]  336- 378-7881   °[facility] [address], [place] 1-800-853-5163 or 336-641-4981   ° °Mobile Crisis Teams °Organization         Address  Phone  Notes  °[facility], Mobile Crisis Care Unit  1-877-626-1772   °Assertive °[facility] ° [address]  [place], [place] 336-834-9664   °Sharon DeEsch [address] °[place] 336-554-5454   ° °Self-Help/Support Groups °Organization         Address  Phone             Notes  °[facility] - variety of support groups  336- 373-1402 Call for more information  °Narcotics Anonymous (NA), [facility] [address], °[place]   2 meetings at this location  ° °  Residential Treatment Programs °Organization         Address  Phone  Notes  °[facility] [address],    °[place]  1-866-801-8205   °[facility] ° [address], [place], [place] 704-293-8524   °[facility] [address], [place] 336-845-3988 Admissions: 8am-3pm M-F  °[facility] [address]    °[place], [place] 336-841-1104   °[facility] [address], [place], [place] 336-379-7146   °[facility] [address]  °[place], [place] 336-285-9073   °[facility] - Intensive Outpatient [address], [place], [place] 336-852-3033   °[facility] [address]  °[place], [place] 1-877-615-2722 or 336-784-9470   °[facility] [address] [place], [place] 336-227-7417 Accepts Medicaid  °[facility] [address]  °[place] 1-800-659-3381 Substance Abuse/Addiction Treatment  ° °[place] Behavioral Health Resources °Organization         Address  Phone  Notes  °[facility]  (888) 581-9988   °Julie Brannon, PhD [address] [place], [place]   (336) 349-5553 or (336) 951-0000   °[facility]   [address] °[place], [place] (336) 349-4454   °[facility] [address], [place], [place] (336) 342-8316 Insurance/Medicaid/sponsorship through [facility]  °[facility] [address]                                    [place], [place] (336) 342-8316 Therapy/tele-psych/case    °[facility] [address]  ° [place], [place] (336) 349-2233    °Dr. Arfeen  (336) 349-4544   °[facility] [place]  [employer] [place] [facility] 1) [address], [place] °2) [address], [place] °3)  [address], [place] (336) 349-3220 °(336) 342-7768 ° °(336) 342-8140   °[place] [facility] (336) 342-1394 or (336) 342-3537 (After Hours)    ° ° °Take your usual prescriptions as previously directed.  Call your regular medical doctor tomorrow to schedule a follow up appointment this week.  Return to the Emergency Department immediately sooner if worsening.  ° °

## 2014-06-17 NOTE — ED Provider Notes (Signed)
CSN: [id number]     Arrival date & time 06/17/14  1316 History   First MD Initiated Contact with Patient 06/17/14 1324     Chief Complaint  Patient presents with  . Foley Cath      HPI Pt was seen at 1325. Per pt and his wife, c/o gradual onset and persistence of constant urinary retention for the past several hours. Pt's wife states pt's chronic indwelling foley catheter "was clogged" this morning. Home Health RN came to his house and took out the foley. Pt's wife states she then realized she did not have another foley catheter to replace it, so they were instructed to come to the ED for foley placement. Pt himself "just feels like I have to go," but otherwise denies any complaints.    Past Medical History  Diagnosis Date  . Cancer     colon and mouth  . CHF (congestive heart failure)   . Coronary artery disease   . Diabetes mellitus without complication   . Hypertension    Past Surgical History  Procedure Laterality Date  . Colon surgery  2005  . Mouth surgery      mouth cancer    History  Substance Use Topics  . Smoking status: Never Smoker   . Smokeless tobacco: Never Used  . Alcohol Use: 6.0 - 7.2 oz/week    10-12 Cans of beer per week    Review of Systems ROS: Statement: All systems negative except as marked or noted in the HPI; Constitutional: Negative for fever and chills. ; ; Eyes: Negative for eye pain, redness and discharge. ; ; ENMT: Negative for ear pain, hoarseness, nasal congestion, sinus pressure and sore throat. ; ; Cardiovascular: Negative for chest pain, palpitations, diaphoresis, dyspnea and peripheral edema. ; ; Respiratory: Negative for cough, wheezing and stridor. ; ; Gastrointestinal: Negative for nausea, vomiting, diarrhea, abdominal pain, blood in stool, hematemesis, jaundice and rectal bleeding. . ; ; Genitourinary: +urinary retention. Negative for dysuria, flank pain and hematuria. ; ; Genital:  No penile drainage or rash, no testicular pain or  swelling, no scrotal rash or swelling.;; Musculoskeletal: Negative for back pain and neck pain. Negative for swelling and trauma.; ; Skin: Negative for pruritus, rash, abrasions, blisters, bruising and skin lesion.; ; Neuro: Negative for headache, lightheadedness and neck stiffness. Negative for weakness, altered level of consciousness , altered mental status, extremity weakness, paresthesias, involuntary movement, seizure and syncope.     Allergies  Review of patient's allergies indicates no known allergies.  Home Medications   Prior to Admission medications   Medication Sig Start Date End Date Taking? Authorizing Provider  aspirin EC 81 MG tablet Take 81 mg by mouth daily.   Yes Historical Provider, MD  citalopram (CELEXA) 20 MG tablet Take 10 mg by mouth daily.   Yes Historical Provider, MD  clobetasol ointment (TEMOVATE) 1.21 % Apply 1 application topically 2 (two) times daily.   Yes Historical Provider, MD  digoxin (LANOXIN) 0.25 MG tablet Take 0.25 mg by mouth daily.   Yes Historical Provider, MD  doxycycline (VIBRA-TABS) 100 MG tablet Take 100 mg by mouth 2 (two) times daily. Continuous use   Yes Historical Provider, MD  furosemide (LASIX) 40 MG tablet Take 40 mg by mouth daily.   Yes Historical Provider, MD  gabapentin (NEURONTIN) 300 MG capsule Take 300 mg by mouth 3 (three) times daily.   Yes Historical Provider, MD  HYDROcodone-acetaminophen (NORCO/VICODIN) 5-325 MG per tablet Take 1 tablet by mouth every  6 (six) hours as needed (pain).   Yes Historical Provider, MD  hydrOXYzine (VISTARIL) 25 MG capsule Take 25 mg by mouth every 8 (eight) hours as needed for itching.   Yes Historical Provider, MD  insulin glargine (LANTUS) 100 UNIT/ML injection Inject 0.15 mLs (15 Units total) into the skin daily. Patient taking differently: Inject 28 Units into the skin daily.  11/08/13  Yes Barton Dubois, MD  insulin regular (NOVOLIN R,HUMULIN R) 100 units/mL injection Inject 0.12 mLs (12 Units  total) into the skin 3 (three) times daily before meals. Patient taking differently: Inject 15-20 Units into the skin 2 (two) times daily before a meal. 20 in the am and 15 at night 11/08/13  Yes Barton Dubois, MD  lisinopril (PRINIVIL,ZESTRIL) 10 MG tablet Take 5 mg by mouth daily.   Yes Historical Provider, MD  loratadine (CLARITIN) 10 MG tablet Take 10 mg by mouth daily.   Yes Historical Provider, MD  metoprolol succinate (TOPROL-XL) 50 MG 24 hr tablet Take 25 mg by mouth daily. Take with or immediately following a meal.   Yes Historical Provider, MD  miconazole (MICOTIN) 2 % powder Apply 1 application topically 2 (two) times daily.   Yes Historical Provider, MD  Multiple Vitamin (MULTIVITAMIN WITH MINERALS) TABS tablet Take 1 tablet by mouth daily. 11/08/13  Yes Barton Dubois, MD  mycophenolate (CELLCEPT) 250 MG capsule Take 1,750 mg by mouth 2 (two) times daily.   Yes Historical Provider, MD  nystatin ointment (MYCOSTATIN) Apply 1 application topically 3 (three) times daily.   Yes Historical Provider, MD  oxybutynin (DITROPAN) 5 MG tablet Take 5 mg by mouth 2 (two) times daily.    Yes Historical Provider, MD  predniSONE (DELTASONE) 10 MG tablet Take 10 mg by mouth daily with breakfast.   Yes Historical Provider, MD  ranitidine (ZANTAC) 150 MG tablet Take 150 mg by mouth 2 (two) times daily.   Yes Historical Provider, MD  vitamin B-12 (CYANOCOBALAMIN) 1000 MCG tablet Take 1,000 mcg by mouth daily.   Yes Historical Provider, MD  cephALEXin (KEFLEX) 500 MG capsule Take 1 capsule (500 mg total) by mouth 4 (four) times daily. Patient not taking: Reported on 06/17/2014 11/08/13   Barton Dubois, MD  lidocaine (XYLOCAINE) 2 % jelly Apply 1 application topically as needed (as directed for catheter change).    Historical Provider, MD   BP 136/77 mmHg  Pulse 65  Temp(Src) 97.6 F (36.4 C) (Axillary)  Resp 22  Ht 6' (1.829 m)  Wt 230 lb (104.327 kg)  BMI 31.19 kg/m2  SpO2 97% Physical Exam   1330: Physical examination:  Nursing notes reviewed; Vital signs and O2 SAT reviewed;  Constitutional: Well developed, Well nourished, Well hydrated, In no acute distress; Head:  Normocephalic, atraumatic; Eyes: EOMI, PERRL, No scleral icterus; ENMT: Mouth and pharynx normal, Mucous membranes moist; Neck: Supple, Full range of motion, No lymphadenopathy; Cardiovascular: Regular rate and rhythm, No gallop; Respiratory: Breath sounds clear & equal bilaterally, No wheezes.  Speaking full sentences with ease, Normal respiratory effort/excursion; Chest: Nontender, Movement normal; Abdomen: Soft, Nontender, Nondistended, Normal bowel sounds; Genitourinary: No CVA tenderness. No genital rash.; Extremities: Pulses normal, No tenderness, No edema, No calf edema or asymmetry.; Neuro: AA&Ox3, Major CN grossly intact.  Speech clear. Moves all extremities spontaneously.; Skin: Color normal, Warm, Dry.   ED Course  Procedures     MDM  MDM Reviewed: previous chart, nursing note and vitals Interpretation: labs      Results for orders placed or  performed during the hospital encounter of 06/17/14  Urinalysis, Routine w reflex microscopic  Result Value Ref Range   Color, Urine YELLOW YELLOW   APPearance CLEAR CLEAR   Specific Gravity, Urine <1.005 (L) 1.005 - 1.030   pH 6.0 5.0 - 8.0   Glucose, UA NEGATIVE NEGATIVE mg/dL   Hgb urine dipstick NEGATIVE NEGATIVE   Bilirubin Urine NEGATIVE NEGATIVE   Ketones, ur NEGATIVE NEGATIVE mg/dL   Protein, ur NEGATIVE NEGATIVE mg/dL   Urobilinogen, UA 0.2 0.0 - 1.0 mg/dL   Nitrite NEGATIVE NEGATIVE   Leukocytes, UA TRACE (A) NEGATIVE  Urine microscopic-add on  Result Value Ref Range   Squamous Epithelial / LPF RARE RARE   WBC, UA 3-6 <3 WBC/hpf   RBC / HPF 0-2 <3 RBC/hpf   Bacteria, UA RARE RARE    1435:  Foley replaced by ED RN. No clear UTI on Udip; UC is pending. Clear/yellow urine in foley bag. Pt states he "is ready to go home now." Dx and testing d/w  pt and family.  Questions answered.  Verb understanding, agreeable to d/c home with outpt f/u.     Francine Graven, DO 06/18/14 1546

## 2014-06-17 NOTE — ED Notes (Signed)
Per wife patient foley cathter was "clogged" so home health nurse removed cathter and did not have one to replace. Patient reports pain in genitals but denies any bleeding or discharge.

## 2014-06-19 LAB — URINE CULTURE
COLONY COUNT: NO GROWTH
Culture: NO GROWTH

## 2014-07-22 ENCOUNTER — Emergency Department (HOSPITAL_COMMUNITY)
Admission: EM | Admit: 2014-07-22 | Discharge: 2014-07-22 | Disposition: A | Discharge status: Home / Self Care | Payer: Medicare Other | Attending: Emergency Medicine | Admitting: Emergency Medicine

## 2014-07-22 ENCOUNTER — Encounter (HOSPITAL_COMMUNITY): Payer: Self-pay | Admitting: *Deleted

## 2014-07-22 DIAGNOSIS — Z792 Long term (current) use of antibiotics: Secondary | ICD-10-CM | POA: Insufficient documentation

## 2014-07-22 DIAGNOSIS — A499 Bacterial infection, unspecified: Secondary | ICD-10-CM

## 2014-07-22 DIAGNOSIS — R339 Retention of urine, unspecified: Secondary | ICD-10-CM | POA: Insufficient documentation

## 2014-07-22 DIAGNOSIS — N390 Urinary tract infection, site not specified: Secondary | ICD-10-CM | POA: Diagnosis not present

## 2014-07-22 DIAGNOSIS — I10 Essential (primary) hypertension: Secondary | ICD-10-CM | POA: Diagnosis not present

## 2014-07-22 DIAGNOSIS — E119 Type 2 diabetes mellitus without complications: Secondary | ICD-10-CM | POA: Insufficient documentation

## 2014-07-22 DIAGNOSIS — Z794 Long term (current) use of insulin: Secondary | ICD-10-CM | POA: Insufficient documentation

## 2014-07-22 DIAGNOSIS — I2510 Atherosclerotic heart disease of native coronary artery without angina pectoris: Secondary | ICD-10-CM | POA: Insufficient documentation

## 2014-07-22 DIAGNOSIS — Z85818 Personal history of malignant neoplasm of other sites of lip, oral cavity, and pharynx: Secondary | ICD-10-CM | POA: Insufficient documentation

## 2014-07-22 DIAGNOSIS — Z85038 Personal history of other malignant neoplasm of large intestine: Secondary | ICD-10-CM | POA: Diagnosis not present

## 2014-07-22 DIAGNOSIS — Z7982 Long term (current) use of aspirin: Secondary | ICD-10-CM | POA: Insufficient documentation

## 2014-07-22 DIAGNOSIS — Z7952 Long term (current) use of systemic steroids: Secondary | ICD-10-CM | POA: Diagnosis not present

## 2014-07-22 DIAGNOSIS — Z79899 Other long term (current) drug therapy: Secondary | ICD-10-CM | POA: Insufficient documentation

## 2014-07-22 DIAGNOSIS — I509 Heart failure, unspecified: Secondary | ICD-10-CM | POA: Insufficient documentation

## 2014-07-22 LAB — URINALYSIS, ROUTINE W REFLEX MICROSCOPIC
Bilirubin Urine: NEGATIVE
GLUCOSE, UA: NEGATIVE mg/dL
KETONES UR: NEGATIVE mg/dL
Nitrite: POSITIVE — AB
PROTEIN: NEGATIVE mg/dL
Specific Gravity, Urine: 1.01 (ref 1.005–1.030)
UROBILINOGEN UA: 0.2 mg/dL (ref 0.0–1.0)
pH: 7.5 (ref 5.0–8.0)

## 2014-07-22 LAB — URINE MICROSCOPIC-ADD ON

## 2014-07-22 MED ORDER — LEVOFLOXACIN 750 MG PO TABS: 750.0000 mg | ORAL_TABLET | Freq: Every day | ORAL | Status: AC

## 2014-07-22 NOTE — ED Notes (Addendum)
Pt's catheter was clogged and wife was told to remove it per home health. Catheter was removed around 4pm and pt had not voided after removal. At time of arrival to the ED, pt's abdomen was distended and very tender to the touch, a bladder scan was completed with a total readable amount >999. [place], NT insert catheter with no resistance, no complaints, etc. Pt immediately voided 1052ml. Amber/Straw/Cloudy appearance. Catheter was clamped and EDP Delo was notified. EDP Delo advised to unclamp catheter and let the catheter flow. This nurse unclamped catheter.

## 2014-07-22 NOTE — ED Notes (Signed)
Pt brought in by [facility] for c/o urinary retention; pt's wife was told by the home health nurse to remove foley catheter because no urine was draining from catheter; pt states he feels the need to void

## 2014-07-22 NOTE — ED Provider Notes (Signed)
CSN: [id number]     Arrival date & time 07/22/14  3151 History  This chart was scribed for Veryl Speak, MD by Randa Evens, ED Scribe. This patient was seen in room APA08/[facility]08 and the patient's care was started at 7:35 PM.     Chief Complaint  Patient presents with  . Urinary Retention    The history is provided by the patient. No language interpreter was used.   HPI Comments: Lawrence Mcknight is a 69 y.o. male brought in by ambulance, who presents to the Emergency Department complaining of urinary retention onset tonight PTA. Wife states that he had a catheter placed that she removed because she thought it was clogged up. Pt states that he feels the need to urinate. Pt doesn't report any other symptoms.   Past Medical History  Diagnosis Date  . Cancer     colon and mouth  . CHF (congestive heart failure)   . Coronary artery disease   . Diabetes mellitus without complication   . Hypertension    Past Surgical History  Procedure Laterality Date  . Colon surgery  2005  . Mouth surgery      mouth cancer   History reviewed. No pertinent family history. History  Substance Use Topics  . Smoking status: Never Smoker   . Smokeless tobacco: Never Used  . Alcohol Use: 6.0 - 7.2 oz/week    10-12 Cans of beer per week    Review of Systems  Genitourinary: Positive for urgency and difficulty urinating.  All other systems reviewed and are negative.    Allergies  Review of patient's allergies indicates no known allergies.  Home Medications   Prior to Admission medications   Medication Sig Start Date End Date Taking? Authorizing Provider  aspirin EC 81 MG tablet Take 81 mg by mouth daily.    Historical Provider, MD  cephALEXin (KEFLEX) 500 MG capsule Take 1 capsule (500 mg total) by mouth 4 (four) times daily. Patient not taking: Reported on 06/17/2014 11/08/13   Barton Dubois, MD  citalopram (CELEXA) 20 MG tablet Take 10 mg by mouth daily.    Historical Provider, MD  clobetasol  ointment (TEMOVATE) 7.61 % Apply 1 application topically 2 (two) times daily.    Historical Provider, MD  digoxin (LANOXIN) 0.25 MG tablet Take 0.25 mg by mouth daily.    Historical Provider, MD  doxycycline (VIBRA-TABS) 100 MG tablet Take 100 mg by mouth 2 (two) times daily. Continuous use    Historical Provider, MD  furosemide (LASIX) 40 MG tablet Take 40 mg by mouth daily.    Historical Provider, MD  gabapentin (NEURONTIN) 300 MG capsule Take 300 mg by mouth 3 (three) times daily.    Historical Provider, MD  HYDROcodone-acetaminophen (NORCO/VICODIN) 5-325 MG per tablet Take 1 tablet by mouth every 6 (six) hours as needed (pain).    Historical Provider, MD  hydrOXYzine (VISTARIL) 25 MG capsule Take 25 mg by mouth every 8 (eight) hours as needed for itching.    Historical Provider, MD  insulin glargine (LANTUS) 100 UNIT/ML injection Inject 0.15 mLs (15 Units total) into the skin daily. Patient taking differently: Inject 28 Units into the skin daily.  11/08/13   Barton Dubois, MD  insulin regular (NOVOLIN R,HUMULIN R) 100 units/mL injection Inject 0.12 mLs (12 Units total) into the skin 3 (three) times daily before meals. Patient taking differently: Inject 15-20 Units into the skin 2 (two) times daily before a meal. 20 in the am and 15 at night  11/08/13   Barton Dubois, MD  lidocaine (XYLOCAINE) 2 % jelly Apply 1 application topically as needed (as directed for catheter change).    Historical Provider, MD  lisinopril (PRINIVIL,ZESTRIL) 10 MG tablet Take 5 mg by mouth daily.    Historical Provider, MD  loratadine (CLARITIN) 10 MG tablet Take 10 mg by mouth daily.    Historical Provider, MD  metoprolol succinate (TOPROL-XL) 50 MG 24 hr tablet Take 25 mg by mouth daily. Take with or immediately following a meal.    Historical Provider, MD  miconazole (MICOTIN) 2 % powder Apply 1 application topically 2 (two) times daily.    Historical Provider, MD  Multiple Vitamin (MULTIVITAMIN WITH MINERALS) TABS tablet  Take 1 tablet by mouth daily. 11/08/13   Barton Dubois, MD  mycophenolate (CELLCEPT) 250 MG capsule Take 1,750 mg by mouth 2 (two) times daily.    Historical Provider, MD  nystatin ointment (MYCOSTATIN) Apply 1 application topically 3 (three) times daily.    Historical Provider, MD  oxybutynin (DITROPAN) 5 MG tablet Take 5 mg by mouth 2 (two) times daily.     Historical Provider, MD  predniSONE (DELTASONE) 10 MG tablet Take 10 mg by mouth daily with breakfast.    Historical Provider, MD  ranitidine (ZANTAC) 150 MG tablet Take 150 mg by mouth 2 (two) times daily.    Historical Provider, MD  vitamin B-12 (CYANOCOBALAMIN) 1000 MCG tablet Take 1,000 mcg by mouth daily.    Historical Provider, MD   BP 173/101 mmHg  Pulse 79  Temp(Src) 97.7 F (36.5 C) (Oral)  Resp 24  Ht 6' (1.829 m)  Wt 220 lb (99.791 kg)  BMI 29.83 kg/m2  SpO2 98%   Physical Exam  Constitutional: He is oriented to person, place, and time. He appears well-developed and well-nourished. No distress.  HENT:  Head: Normocephalic and atraumatic.  Eyes: Conjunctivae and EOM are normal.  Neck: Neck supple. No tracheal deviation present.  Cardiovascular: Normal rate.   Pulmonary/Chest: Effort normal. No respiratory distress.  Abdominal: There is tenderness in the suprapubic area. There is no rebound and no guarding.  suprapubic fullness and tenderness.   Musculoskeletal: Normal range of motion.  Neurological: He is alert and oriented to person, place, and time.  Skin: Skin is warm and dry.  Psychiatric: He has a normal mood and affect. His behavior is normal.  Nursing note and vitals reviewed.   ED Course  Procedures (including critical care time) DIAGNOSTIC STUDIES: Oxygen Saturation is 98% on RA, normal by my interpretation.    COORDINATION OF CARE: 7:38 PM-Discussed treatment plan with pt at bedside and pt agreed to plan.     Labs Review Labs Reviewed - No data to display  Imaging Review No results found.    EKG Interpretation None      MDM   Final diagnoses:  None      Patient with indwelling Foley catheter at home presents with complaints of urinary retention. Bladder scanner reveals greater than 1000 mL. A Foley catheter was placed and 1300 mL of urine were obtained. Urinalysis reveals positive nitrite and moderate leukocytes. This may represent bacterial colonization of the Foley, however may also represent UTI. Will treat with Levaquin and when necessary follow-up.   I personally performed the services described in this documentation, which was scribed in my presence. The recorded information has been reviewed and is accurate.       Veryl Speak, MD 07/22/14 2014

## 2014-07-22 NOTE — Discharge Instructions (Signed)
Levaquin as prescribed.  Follow-up for Foley catheter removal at the previously recommended time.  Return to the emergency department if you develop any new or worsening symptoms.   Acute Urinary Retention Acute urinary retention is the temporary inability to urinate. This is a common problem in older men. As men age their prostates become larger and block the flow of urine from the bladder. This is usually a problem that has come on gradually.  HOME CARE INSTRUCTIONS If you are sent home with a Foley catheter and a drainage system, you will need to discuss the best course of action with your health care provider. While the catheter is in, maintain a good intake of fluids. Keep the drainage bag emptied and lower than your catheter. This is so that contaminated urine will not flow back into your bladder, which could lead to a urinary tract infection. There are two main types of drainage bags. One is a large bag that usually is used at night. It has a good capacity that will allow you to sleep through the night without having to empty it. The second type is called a leg bag. It has a smaller capacity, so it needs to be emptied more frequently. However, the main advantage is that it can be attached by a leg strap and can go underneath your clothing, allowing you the freedom to move about or leave your home. Only take over-the-counter or prescription medicines for pain, discomfort, or fever as directed by your health care provider.  SEEK MEDICAL CARE IF:  You develop a low-grade fever.  You experience spasms or leakage of urine with the spasms. SEEK IMMEDIATE MEDICAL CARE IF:   You develop chills or fever.  Your catheter stops draining urine.  Your catheter falls out.  You start to develop increased bleeding that does not respond to rest and increased fluid intake. MAKE SURE YOU:  Understand these instructions.  Will watch your condition.  Will get help right away if you are not doing  well or get worse. Document Released: 09/21/2000 Document Revised: 06/20/2013 Document Reviewed: 11/24/2012 [facility] Patient Information 2015 [place], [place]. This information is not intended to replace advice given to you by your health care provider. Make sure you discuss any questions you have with your health care provider.  Urinary Tract Infection Urinary tract infections (UTIs) can develop anywhere along your urinary tract. Your urinary tract is your body's drainage system for removing wastes and extra water. Your urinary tract includes two kidneys, two ureters, a bladder, and a urethra. Your kidneys are a pair of bean-shaped organs. Each kidney is about the size of your fist. They are located below your ribs, one on each side of your spine. CAUSES Infections are caused by microbes, which are microscopic organisms, including fungi, viruses, and bacteria. These organisms are so small that they can only be seen through a microscope. Bacteria are the microbes that most commonly cause UTIs. SYMPTOMS  Symptoms of UTIs may vary by age and gender of the patient and by the location of the infection. Symptoms in young women typically include a frequent and intense urge to urinate and a painful, burning feeling in the bladder or urethra during urination. Older women and men are more likely to be tired, shaky, and weak and have muscle aches and abdominal pain. A fever may mean the infection is in your kidneys. Other symptoms of a kidney infection include pain in your back or sides below the ribs, nausea, and vomiting. DIAGNOSIS To diagnose  a UTI, your caregiver will ask you about your symptoms. Your caregiver also will ask to provide a urine sample. The urine sample will be tested for bacteria and white blood cells. White blood cells are made by your body to help fight infection. TREATMENT  Typically, UTIs can be treated with medication. Because most UTIs are caused by a bacterial infection, they usually can  be treated with the use of antibiotics. The choice of antibiotic and length of treatment depend on your symptoms and the type of bacteria causing your infection. HOME CARE INSTRUCTIONS  If you were prescribed antibiotics, take them exactly as your caregiver instructs you. Finish the medication even if you feel better after you have only taken some of the medication.  Drink enough water and fluids to keep your urine clear or pale yellow.  Avoid caffeine, tea, and carbonated beverages. They tend to irritate your bladder.  Empty your bladder often. Avoid holding urine for long periods of time.  Empty your bladder before and after sexual intercourse.  After a bowel movement, women should cleanse from front to back. Use each tissue only once. SEEK MEDICAL CARE IF:   You have back pain.  You develop a fever.  Your symptoms do not begin to resolve within 3 days. SEEK IMMEDIATE MEDICAL CARE IF:   You have severe back pain or lower abdominal pain.  You develop chills.  You have nausea or vomiting.  You have continued burning or discomfort with urination. MAKE SURE YOU:   Understand these instructions.  Will watch your condition.  Will get help right away if you are not doing well or get worse. Document Released: 03/25/2005 Document Revised: 12/15/2011 Document Reviewed: 07/24/2011 [facility] Patient Information 2015 [place], [place]. This information is not intended to replace advice given to you by your health care provider. Make sure you discuss any questions you have with your health care provider.

## 2014-12-28 DEATH — deceased
